# Patient Record
Sex: Female | Born: 1985 | Race: White | Hispanic: No | Marital: Married | Smoking: Former smoker
Health system: Southern US, Community
[De-identification: ages and names within clinical notes are randomized; demographics above are authoritative.]

## PROBLEM LIST (undated history)

## (undated) DIAGNOSIS — G43909 Migraine, unspecified, not intractable, without status migrainosus: Secondary | ICD-10-CM

## (undated) DIAGNOSIS — D219 Benign neoplasm of connective and other soft tissue, unspecified: Secondary | ICD-10-CM

## (undated) HISTORY — PX: OTHER SURGICAL HISTORY: SHX169

## (undated) HISTORY — PX: EYE SURGERY: SHX253

---
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
## 2005-01-17 ENCOUNTER — Ambulatory Visit: Payer: Self-pay | Admitting: Internal Medicine

## 2005-08-04 ENCOUNTER — Other Ambulatory Visit: Admission: RE | Admit: 2005-08-04 | Discharge: 2005-08-04 | Payer: Self-pay | Admitting: Gynecology

## 2016-06-05 DIAGNOSIS — R51 Headache: Secondary | ICD-10-CM | POA: Diagnosis not present

## 2016-06-06 ENCOUNTER — Emergency Department (HOSPITAL_COMMUNITY)
Admission: EM | Admit: 2016-06-06 | Discharge: 2016-06-06 | Disposition: A | Discharge status: Home / Self Care | Payer: BLUE CROSS/BLUE SHIELD | Attending: Emergency Medicine | Admitting: Emergency Medicine

## 2016-06-06 ENCOUNTER — Emergency Department (HOSPITAL_COMMUNITY): Payer: BLUE CROSS/BLUE SHIELD

## 2016-06-06 ENCOUNTER — Encounter (HOSPITAL_COMMUNITY): Payer: Self-pay | Admitting: Emergency Medicine

## 2016-06-06 DIAGNOSIS — R51 Headache: Secondary | ICD-10-CM | POA: Diagnosis not present

## 2016-06-06 DIAGNOSIS — R519 Headache, unspecified: Secondary | ICD-10-CM

## 2016-06-06 HISTORY — DX: Migraine, unspecified, not intractable, without status migrainosus: G43.909

## 2016-06-06 LAB — CBC
HCT: 38.6 % (ref 36.0–46.0)
Hemoglobin: 12.9 g/dL (ref 12.0–15.0)
MCH: 30 pg (ref 26.0–34.0)
MCHC: 33.4 g/dL (ref 30.0–36.0)
MCV: 89.8 fL (ref 78.0–100.0)
Platelets: 231 10*3/uL (ref 150–400)
RBC: 4.3 MIL/uL (ref 3.87–5.11)
RDW: 13.1 % (ref 11.5–15.5)
WBC: 5.9 10*3/uL (ref 4.0–10.5)

## 2016-06-06 LAB — BASIC METABOLIC PANEL
ANION GAP: 5 (ref 5–15)
BUN: 13 mg/dL (ref 6–20)
CALCIUM: 9.2 mg/dL (ref 8.9–10.3)
CO2: 26 mmol/L (ref 22–32)
Chloride: 109 mmol/L (ref 101–111)
Creatinine, Ser: 0.55 mg/dL (ref 0.44–1.00)
Glucose, Bld: 104 mg/dL — ABNORMAL HIGH (ref 65–99)
Potassium: 3.6 mmol/L (ref 3.5–5.1)
SODIUM: 140 mmol/L (ref 135–145)

## 2016-06-06 LAB — I-STAT BETA HCG BLOOD, ED (MC, WL, AP ONLY): I-stat hCG, quantitative: <5 m[IU]/mL (ref <5)

## 2016-06-06 MED ORDER — KETOROLAC TROMETHAMINE 30 MG/ML IJ SOLN
30.0000 mg | Freq: Once | INTRAMUSCULAR | Status: AC
  Administered 2016-06-06: 30 mg via INTRAVENOUS
  Filled 2016-06-06: qty 1

## 2016-06-06 MED ORDER — DIPHENHYDRAMINE HCL 50 MG/ML IJ SOLN
12.5000 mg | Freq: Once | INTRAMUSCULAR | Status: AC
  Administered 2016-06-06: 12.5 mg via INTRAVENOUS
  Filled 2016-06-06: qty 1

## 2016-06-06 MED ORDER — METOCLOPRAMIDE HCL 5 MG/ML IJ SOLN
10.0000 mg | Freq: Once | INTRAMUSCULAR | Status: AC
  Administered 2016-06-06: 10 mg via INTRAVENOUS
  Filled 2016-06-06: qty 2

## 2016-06-06 MED ORDER — SODIUM CHLORIDE 0.9 % IV BOLUS (SEPSIS)
500.0000 mL | Freq: Once | INTRAVENOUS | Status: AC
  Administered 2016-06-06: 500 mL via INTRAVENOUS

## 2016-06-06 NOTE — ED Notes (Addendum)
Pt is c/o of recurrent migraines, pt states that she has had a HA 1 x every 3 weeks for the past 3 weeks. Pt states that vshe took Excedrin for pain however provided no relief. Pt states that she has an appt on Monday for the recurring HA.  Pt states that her HA are accompanied by nausea.

## 2016-06-06 NOTE — ED Provider Notes (Signed)
[facility] DEPT Provider Note   CSN: [id number] Arrival date & time: 06/05/16  2338  By signing my name below, I, Marcello Moores, attest that this documentation has been prepared under the direction and in the presence of [employer], PA-C. Electronically Signed: Marcello Moores, ED Scribe. 06/06/16. 1:52 AM.   History   Chief Complaint Chief Complaint  Patient presents with  . Migraine   The history is provided by the patient and medical records. No language interpreter was used.   HPI Comments: Kayla Hayden is a 31 y.o. female, with a PMHx of self diagnosed migraines, presents to the Emergency Department complaining of constant frontal headache which began yesterday morning around 8AM. She localizes her pain to her right eye and down the right-side of her nose. Pt notes that she has a h/o headaches which have been intermittently present over the past three years. She notes that her most current bout of this began three weeks ago, and returned this morning and has been present and worsening throughout the day. She has associated nausea, light-headedness, photophobia, and mild phonophobia. She reports symptoms are always the same.  No thunderclap or sudden onset headache.  She has an appointment with a new PCP on next Monday to discuss referral to a Neurologist. She has never been formally dx'd w/ migraines. She has not self-medicated with OTC medications today for her headache. Pt denies vomiting, visual disturbance, syncope, weakness, or any other associated symptoms.   Past Medical History:  Diagnosis Date  . Migraine    There are no active problems to display for this patient.  Past Surgical History:  Procedure Laterality Date  . extraction of wisdom teeth     OB History    No data available     Home Medications    Prior to Admission medications   Not on File   Family History Family History  Problem Relation Age of Onset  . Cancer Father    Social  History Social History  Substance Use Topics  . Smoking status: Never Smoker  . Smokeless tobacco: Never Used  . Alcohol use No   Allergies   Patient has no known allergies.  Review of Systems Review of Systems  Eyes: Positive for photophobia. Negative for visual disturbance.  Gastrointestinal: Positive for nausea. Negative for vomiting.  Neurological: Positive for light-headedness and headaches. Negative for syncope and weakness.  All other systems reviewed and are negative.  Physical Exam Updated Vital Signs BP (!) 133/91 (BP Location: Right Arm)   Pulse 76   Temp 98.1 F (36.7 C) (Oral)   Resp 14   Ht 5\' 5"  (1.651 m)   Wt 115 lb (52.2 kg)   LMP 05/22/2016 (Exact Date)   SpO2 100%   BMI 19.14 kg/m   Physical Exam  Constitutional: She is oriented to person, place, and time. She appears well-developed and well-nourished. No distress.  HENT:  Head: Normocephalic and atraumatic.  Mouth/Throat: Oropharynx is clear and moist.  Eyes: Conjunctivae and EOM are normal. Pupils are equal, round, and reactive to light. No scleral icterus.  No horizontal, vertical or rotational nystagmus  Neck: Normal range of motion. Neck supple.  Full active and passive ROM without pain No midline or paraspinal tenderness No nuchal rigidity or meningeal signs  Cardiovascular: Normal rate, regular rhythm and intact distal pulses.   Pulmonary/Chest: Effort normal and breath sounds normal. No respiratory distress. She has no wheezes. She has no rales.  Abdominal: Soft. Bowel sounds are normal. There  is no tenderness. There is no rebound and no guarding.  Musculoskeletal: Normal range of motion.  Lymphadenopathy:    She has no cervical adenopathy.  Neurological: She is alert and oriented to person, place, and time. No cranial nerve deficit. She exhibits normal muscle tone. Coordination normal.  Mental Status:  Alert, oriented, thought content appropriate. Speech fluent without evidence of aphasia.  Able to follow 2 step commands without difficulty.  Cranial Nerves:  II:  Peripheral visual fields grossly normal, pupils equal, round, reactive to light III,IV, VI: ptosis not present, extra-ocular motions intact bilaterally  V,VII: smile symmetric, facial light touch sensation equal VIII: hearing grossly normal bilaterally  IX,X: midline uvula rise  XI: bilateral shoulder shrug equal and strong XII: midline tongue extension  Motor:  5/5 in upper and lower extremities bilaterally including strong and equal grip strength and dorsiflexion/plantar flexion Sensory: Pinprick and light touch normal in all extremities.  Cerebellar: normal finger-to-nose with bilateral upper extremities Gait: normal gait and balance CV: distal pulses palpable throughout   Skin: Skin is warm and dry. No rash noted. She is not diaphoretic.  Psychiatric: She has a normal mood and affect. Her behavior is normal. Judgment and thought content normal.  Nursing note and vitals reviewed.  ED Treatments / Results  DIAGNOSTIC STUDIES: Oxygen Saturation is 100% on RA, normal by my interpretation.   COORDINATION OF CARE: 1:42 AM-Discussed next steps with pt. Pt verbalized understanding and is agreeable with the plan.   Labs (all labs ordered are listed, but only abnormal results are displayed) Labs Reviewed  BASIC METABOLIC PANEL - Abnormal; Notable for the following:       Result Value   Glucose, Bld 104 (*)    All other components within normal limits  CBC  I-STAT BETA HCG BLOOD, ED ([facility], [facility], [facility] ONLY)   Radiology Ct Head Wo Contrast  Result Date: 06/06/2016 CLINICAL DATA:  Frontal and right retro-orbital headache for 2 days. Nausea and photophobia. EXAM: CT HEAD WITHOUT CONTRAST TECHNIQUE: Contiguous axial images were obtained from the base of the skull through the vertex without intravenous contrast. COMPARISON:  None. FINDINGS: Brain: There is no intracranial hemorrhage, mass or evidence of acute infarction.  There is no extra-axial fluid collection. Gray matter and white matter appear normal. Cerebral volume is normal for age. Brainstem and posterior fossa are unremarkable. The CSF spaces appear normal. Vascular: No hyperdense vessel or unexpected calcification. Skull: Normal. Negative for fracture or focal lesion. Sinuses/Orbits: No acute finding. Other: None. IMPRESSION: Normal brain Electronically Signed   By: Andreas Newport M.D.   On: 06/06/2016 04:05    Procedures Procedures   Medications Ordered in ED Medications  sodium chloride 0.9 % bolus 500 mL (500 mLs Intravenous New Bag/Given 06/06/16 0230)  metoCLOPramide (REGLAN) injection 10 mg (10 mg Intravenous Given 06/06/16 0231)  diphenhydrAMINE (BENADRYL) injection 12.5 mg (12.5 mg Intravenous Given 06/06/16 0231)  ketorolac (TORADOL) 30 MG/ML injection 30 mg (30 mg Intravenous Given 06/06/16 0456)    Initial Impression / Assessment and Plan / ED Course  I have reviewed the triage vital signs and the nursing notes.  Pertinent labs & imaging results that were available during my care of the patient were reviewed by me and considered in my medical decision making (see chart for details).     Pt HA treated and improved while in ED.  Presentation is like pts typical HA and non concerning for [facility] - Kmi, ICH, Meningitis, or temporal arteritis. Pt is afebrile with no  focal neuro deficits, nuchal rigidity, or change in vision. CT scan is without acute abnormality.  Pt is to follow up with PCP and neurology to discuss prophylactic medication. Pt verbalizes understanding and is agreeable with plan to dc.    Final Clinical Impressions(s) / ED Diagnoses   Final diagnoses:  Bad headache   New Prescriptions New Prescriptions   No medications on file   I personally performed the services described in this documentation, which was scribed in my presence. The recorded information has been reviewed and is accurate.     Jarrett Soho Ofilia Rayon, PA-C 06/06/16  [place], MD 06/06/16 [phone]

## 2016-06-06 NOTE — ED Triage Notes (Signed)
Pt states she woke up with a headache this morning around 8am and it has progressively gotten worse throughout the day  Pt states it got real bad around 3pm  Pt states the pain is around the right eye and down her nose  Pt states her body feels tingly all over  Pt says she is nauseated but has not vomited yet  Pt states this is the 4th migraine she has had since the middle of Feb

## 2016-06-06 NOTE — Discharge Instructions (Signed)
1. Medications: usual home medications 2. Treatment: rest, drink plenty of fluids,  3. Follow Up: Please followup with your primary doctor in 7 days and [facility] at your earliest convenience for discussion of your diagnoses and further evaluation after today's visit; if you do not have a primary care doctor use the resource guide provided to find one; Please return to the ER for worsening symptoms, development of neurologic symptoms or other concerns

## 2016-06-07 ENCOUNTER — Encounter: Payer: Self-pay | Admitting: Diagnostic Neuroimaging

## 2016-06-07 ENCOUNTER — Ambulatory Visit (INDEPENDENT_AMBULATORY_CARE_PROVIDER_SITE_OTHER): Payer: BLUE CROSS/BLUE SHIELD | Admitting: Diagnostic Neuroimaging

## 2016-06-07 VITALS — BP 109/74 | HR 89

## 2016-06-07 DIAGNOSIS — G43009 Migraine without aura, not intractable, without status migrainosus: Secondary | ICD-10-CM

## 2016-06-07 MED ORDER — PROMETHAZINE HCL 12.5 MG PO TABS: 12.5000 mg | ORAL_TABLET | Freq: Four times a day (QID) | ORAL | 0 refills | Status: DC | PRN

## 2016-06-07 MED ORDER — RIZATRIPTAN BENZOATE 10 MG PO TBDP: 10.0000 mg | ORAL_TABLET | ORAL | 6 refills | Status: DC | PRN

## 2016-06-07 NOTE — Patient Instructions (Signed)
Thank you for coming to see [place] at [facility]. I hope we have been able to provide you high quality care today.  You may receive a patient satisfaction survey over the next few weeks. We would appreciate your feedback and comments so that we may continue to improve ourselves and the health of our patients.  - rizatriptan 33m as needed for breakthrough headache; may repeat x 1 after 2 hours; max 2 tabs per day or 8 per month  - try the migraine buddy app  - To prevent or relieve headaches, try the following:   Cool Compress. Lie down and place a cool compress on your head.   Avoid headache triggers. If certain foods or odors seem to have triggered your migraines in the past, avoid them. A headache diary might help you identify triggers.   Include physical activity in your daily routine.   Manage stress. Find healthy ways to cope with the stressors, such as delegating tasks on your to-do list.   Practice relaxation techniques. Try deep breathing, yoga, massage and visualization.   Eat regularly. Eating regularly scheduled meals and maintaining a healthy diet might help prevent headaches. Also, drink plenty of fluids.   Follow a regular sleep schedule. Sleep deprivation might contribute to headaches  Consider biofeedback. With this mind-body technique, you learn to control certain bodily functions - such as muscle tension, heart rate and blood pressure - to prevent headaches or reduce headache pain.    ~~~~~~~~~~~~~~~~~~~~~~~~~~~~~~~~~~~~~~~~~~~~~~~~~~~~~~~~~~~~~~~~~  DR. PENUMALLI'S GUIDE TO HAPPY AND HEALTHY LIVING These are some of my general health and wellness recommendations. Some of them may apply to you better than others. Please use common sense as you try these suggestions and feel free to ask me any questions.   ACTIVITY/FITNESS Mental, social, emotional and physical stimulation are very important for brain and body health. Try learning a new  activity (arts, music, language, sports, games).  Keep moving your body to the best of your abilities. You can do this at home, inside or outside, the park, community center, gym or anywhere you like. Consider a physical therapist or personal trainer to get started. Consider the app Sworkit. Fitness trackers such as smart-watches, smart-phones or Fitbits can help as well.   NUTRITION Eat more plants: colorful vegetables, nuts, seeds and berries.  Eat less sugar, salt, preservatives and processed foods.  Avoid toxins such as cigarettes and alcohol.  Drink water when you are thirsty. Warm water with a slice of lemon is an excellent morning drink to start the day.  Consider these websites for more information The Nutrition Source (hhttps://www.henry-hernandez.biz/ [employer] Nutrition (wWindowBlog.ch   RELAXATION Consider practicing mindfulness meditation or other relaxation techniques such as deep breathing, prayer, yoga, tai chi, massage. See website mindful.org or the apps Headspace or Calm to help get started.   SLEEP Try to get at least 7-8+ hours sleep per day. Regular exercise and reduced caffeine will help you sleep better. Practice good sleep hygeine techniques. See website sleep.org for more information.   PLANNING Prepare estate planning, living will, healthcare POA documents. Sometimes this is best planned with the help of an attorney. Theconversationproject.org and agingwithdignity.org are excellent resources.
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
## 2016-06-12 DIAGNOSIS — G43909 Migraine, unspecified, not intractable, without status migrainosus: Secondary | ICD-10-CM | POA: Diagnosis not present

## 2016-06-12 DIAGNOSIS — F419 Anxiety disorder, unspecified: Secondary | ICD-10-CM | POA: Diagnosis not present

## 2016-06-12 DIAGNOSIS — F410 Panic disorder [episodic paroxysmal anxiety] without agoraphobia: Secondary | ICD-10-CM | POA: Diagnosis not present

## 2016-08-01 ENCOUNTER — Telehealth: Payer: Self-pay | Admitting: *Deleted

## 2016-08-01 NOTE — Telephone Encounter (Signed)
Spoke with patient to reschedule her July 6th follow up due to dr being out of office. She accepted appt tomorrow afternoon. She verbalized understanding to arrive 30 min early for check in.

## 2016-08-02 ENCOUNTER — Ambulatory Visit (INDEPENDENT_AMBULATORY_CARE_PROVIDER_SITE_OTHER): Payer: BLUE CROSS/BLUE SHIELD | Admitting: Diagnostic Neuroimaging

## 2016-08-02 ENCOUNTER — Encounter (INDEPENDENT_AMBULATORY_CARE_PROVIDER_SITE_OTHER): Payer: Self-pay

## 2016-08-02 ENCOUNTER — Encounter: Payer: Self-pay | Admitting: Diagnostic Neuroimaging

## 2016-08-02 VITALS — BP 118/75 | HR 74 | Wt 114.6 lb

## 2016-08-02 DIAGNOSIS — G43009 Migraine without aura, not intractable, without status migrainosus: Secondary | ICD-10-CM | POA: Diagnosis not present

## 2016-08-02 MED ORDER — AMITRIPTYLINE HCL 25 MG PO TABS: 25.0000 mg | ORAL_TABLET | Freq: Every day | ORAL | 6 refills | Status: DC

## 2016-08-02 MED ORDER — DICLOFENAC POTASSIUM(MIGRAINE) 50 MG PO PACK: 50.0000 mg | PACK | ORAL | 3 refills | Status: DC | PRN

## 2016-08-02 MED ORDER — RIZATRIPTAN BENZOATE 10 MG PO TBDP: 10.0000 mg | ORAL_TABLET | ORAL | 6 refills | Status: DC | PRN
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
## 2016-08-02 NOTE — Patient Instructions (Signed)
-   start amitriptyline 25mg  at bedtime; stop zoloft  - trial of cambia as needed for migraine rescue; 1 packet per day maximum  - continue rizatriptan 10mg  as needed for breakthrough headache; may repeat x 1 after 2 hours; max 2 tabs per day or 8 per month  - may consider botox for chronic migraine if headache frequency increases

## 2016-08-03 ENCOUNTER — Telehealth: Payer: Self-pay | Admitting: *Deleted

## 2016-08-03 NOTE — Telephone Encounter (Signed)
Received fax from [facility] re: insurance denial of coverage for Cambia powder. New Rx request successfully  faxed to [facility].

## 2016-08-07 ENCOUNTER — Telehealth: Payer: Self-pay | Admitting: Diagnostic Neuroimaging

## 2016-08-07 NOTE — Telephone Encounter (Signed)
Kayla Hayden from [facility] [place] called to inform of denial request for medication:Diclofenac Potassium (CAMBIA) 50 MG PACK  need to try Diclofenac 1st.

## 2016-08-07 NOTE — Telephone Encounter (Signed)
Noted.  Prescription sent to [facility]. (see previous note).

## 2016-08-11 ENCOUNTER — Ambulatory Visit: Payer: BLUE CROSS/BLUE SHIELD | Admitting: Diagnostic Neuroimaging

## 2016-08-14 NOTE — Telephone Encounter (Signed)
Called [facility] re: status of Cambia prescription. Spoke [facility] who stated a PA was submitted on 08/07/16. He checked PA dept and stated that it was denied. He stated with a pharmacy discount card she can get medication with a  copay of $20. He stated [facility] will call patient and inform her.

## 2016-08-30 NOTE — Telephone Encounter (Signed)
Called to check on status of cambia from [facility].  [phone], fax [phone].    PA was denied, using copay card good for 6 months.  (from 08-16-16), after this copay will go to $50.00.  Pt has appt in 10/2016 with MM/NP. Can discuss when in for appt to see if working and appeal.

## 2016-11-01 ENCOUNTER — Ambulatory Visit (INDEPENDENT_AMBULATORY_CARE_PROVIDER_SITE_OTHER): Payer: BLUE CROSS/BLUE SHIELD | Admitting: Adult Health

## 2016-11-01 ENCOUNTER — Encounter (INDEPENDENT_AMBULATORY_CARE_PROVIDER_SITE_OTHER): Payer: Self-pay

## 2016-11-01 ENCOUNTER — Encounter: Payer: Self-pay | Admitting: Adult Health

## 2016-11-01 VITALS — BP 109/75 | HR 84 | Ht 64.0 in | Wt 120.0 lb

## 2016-11-01 DIAGNOSIS — G43009 Migraine without aura, not intractable, without status migrainosus: Secondary | ICD-10-CM

## 2016-11-01 MED ORDER — AMITRIPTYLINE HCL 10 MG PO TABS: 30.0000 mg | ORAL_TABLET | Freq: Every day | ORAL | 5 refills | Status: DC
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
## 2016-11-01 NOTE — Patient Instructions (Addendum)
Your Plan:  Increase Amtriptyline 30 mg at bedtime Continue maxalt for headache To prevent or relieve headaches, try the following: Cool Compress. Lie down and place a cool compress on your head.  Avoid headache triggers. If certain foods or odors seem to have triggered your migraines in the past, avoid them. A headache diary might help you identify triggers.  Include physical activity in your daily routine. Try a daily walk or other moderate aerobic exercise.  Manage stress. Find healthy ways to cope with the stressors, such as delegating tasks on your to-do list.  Practice relaxation techniques. Try deep breathing, yoga, massage and visualization.  Eat regularly. Eating regularly scheduled meals and maintaining a healthy diet might help prevent headaches. Also, drink plenty of fluids.  Follow a regular sleep schedule. Sleep deprivation might contribute to headaches Consider biofeedback. With this mind-body technique, you learn to control certain bodily functions - such as muscle tension, heart rate and blood pressure - to prevent headaches or reduce headache pain.    Proceed to emergency room if you experience new or worsening symptoms or symptoms do not resolve, if you have new neurologic symptoms or if headache is severe, or for any concerning symptom.     Thank you for coming to see [place] at [facility]. I hope we have been able to provide you high quality care today.  You may receive a patient satisfaction survey over the next few weeks. We would appreciate your feedback and comments so that we may continue to improve ourselves and the health of our patients.

## 2016-11-21 NOTE — Progress Notes (Signed)
I reviewed note and agree with plan.   Tomica Arseneault R. Adalin Vanderploeg, MD  Certified in Neurology, Neurophysiology and Neuroimaging  [facility] [address], [address] [place], [place] [address] [phone]   

## 2016-11-29 ENCOUNTER — Other Ambulatory Visit: Payer: Self-pay | Admitting: Adult Health

## 2016-12-13 ENCOUNTER — Telehealth: Payer: Self-pay | Admitting: Adult Health

## 2016-12-13 NOTE — Telephone Encounter (Signed)
Patient is having bloody stools taking amitriptyline (ELAVIL) 10 MG tablet. Is there another medication she can take? [facility] [address].

## 2016-12-14 MED ORDER — TOPIRAMATE 25 MG PO TABS: ORAL_TABLET | ORAL | 5 refills | Status: DC

## 2016-12-14 NOTE — Telephone Encounter (Addendum)
I called the patient.  She states that amitriptyline has been beneficial for her headaches however she cannot tolerate side effect of constipation.  She states that she is having blood in the stool that she feels may be due to constipation.  She does plan to follow-up with her PCP regarding this.  We will wean the patient off of amitriptyline.  She will decrease to 20 mg for 1 week and then 10 mg for 1 week and then stop the medication.  She will be started on Topamax 25 mg at bedtime for 1 week then increasing to 50 mg thereafter.  I have reviewed potential side effects with the patient.  I have advised the patient that Topamax is not compatible with pregnancy therefore she should use two birth control methods in order to ensure that she does not get pregnant on this medication.  I advised the patient that Topamax is a pregnancy category D.  It has been found that Topamax can cause fetal harm if administered to a pregnant woman-an  increased risks of oral cleft and for being small for gestational age has been observed following in utero exposure ( Data from [place]). She voiced understanding.  We also discussed potentially trying Inderal or gabapentin versus Topamax.  She states that she would rather try Topamax.  she is advised that if her symptoms do not improve she should let us know.  She voiced understanding.

## 2016-12-14 NOTE — Addendum Note (Signed)
Addended by: Trudie Buckler on: 12/14/2016 10:41 AM   Modules accepted: Orders

## 2016-12-14 NOTE — Telephone Encounter (Addendum)
Spoke with patient and advised her that Amitriptyline typically doesn't cause bloody stools but can cause constipation or diarrhea. Inquired if she has experienced either of these. She stated she has been very constipated, straining with every BM. She takes fiber and probiotic daily. She also has history of hemorrhoids. This RN advised she may have bleeding from straining plus hemorrhoids. Patient stated the blood in the toilet is as if she is having her menstrual cycle. This RN advised she needs to call her PCP to discuss, but also will send note to NP. Patient then stated she is not happy with amitriptyline as headache preventative anyway. Would like to switch to another medication that is compatible with Zoloft so she can restart taking it.  This RN stated will route her request to NP. Patient verbalized understanding, appreciation.

## 2017-01-17 ENCOUNTER — Other Ambulatory Visit: Payer: Self-pay | Admitting: Neurology

## 2017-01-17 MED ORDER — AMITRIPTYLINE HCL 25 MG PO TABS: 25.0000 mg | ORAL_TABLET | Freq: Every day | ORAL | 5 refills | Status: DC

## 2017-01-17 NOTE — Telephone Encounter (Signed)
Pt called she experienced dizziness and felt "out of it", hands and feet numb and tingling while taking topamax. 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 She is wanting to know if she could go back on amitriptyline. Please call to advise at [phone]

## 2017-01-17 NOTE — Telephone Encounter (Signed)
Called the patient and she states that if she remembers correctly she didn't have as much constipation issues with it at the strength of 25 mg and she would like to try that dosage first. The patient states that with taking the Topamax she may not have given it the amount of time to work and be beneficial. I informed the pt that after a while of using it those side effects can lighten up. The patient states that she doesn't want to try that again. She would rather try and start with the amitriptyline. I confirmed her pharmacy and explained that we would send those orders for her today. Pt verbalized understanding.

## 2017-01-17 NOTE — Telephone Encounter (Signed)
Ok to go back to amitriptyline 30 mg at bedtime. However she stopped it d/t to constipation? Let her know that the side effects of Topamax can sometimes get better overtime if she has found it beneficial for her headaches.

## 2017-03-14 ENCOUNTER — Ambulatory Visit: Payer: BLUE CROSS/BLUE SHIELD | Admitting: Adult Health

## 2017-06-06 ENCOUNTER — Telehealth: Payer: Self-pay | Admitting: Adult Health

## 2017-06-06 NOTE — Telephone Encounter (Signed)
Spoke to Kayla Hayden and she is 2.[redacted] wks pregnant.  She has not seen ob-gyn yet. (will be filling out p/w and making appt with them soon).  She is not taking amitriptyline as not working, her pcp change to inderal and she stopped this 2 wks ago. She is asking about taking maxalt, phenergan and cambia while pregnant, and if not are there other options.  She is not taking anything right now.  Her pcp is Stage manager PA with [employer] at [facility].  She is ok to wait until Dr. Leta Baptist back tomorrow to address.  Has appt with MM/NP 07/05/2017 for RV.

## 2017-06-06 NOTE — Telephone Encounter (Signed)
Pt called stating that earlier last week she found out she is pregnant. Stating its very early on and is wanting to make sure all rescue medications are safe for her to take. Please call to advise

## 2017-06-07 NOTE — Telephone Encounter (Signed)
Pt picked up then hung up.  I called back and LMVM for her per Dr. Leta Baptist recommendation relating to taking migraine medications while pregnant. I relayed per Dr. Leta Baptist that for maximum safety, he did not recommend taking any of the meds (cambia, phenergan, maxalt, elavil) during pregnancy except tylenol as needed.  I f headaches are very severe, then can discuss risk, vs benefit analysis of maxalt.  She has a ppt 5/30 with MM/NP.  She is to call back as needed.

## 2017-06-07 NOTE — Telephone Encounter (Signed)
In general for maximum safety, I do not recommend any of these meds during pregnancy except tylenol as needed.   If headaches are very severe, then we can discuss risk / benefit analysis of maxalt (triptan).   -VRP

## 2017-06-21 ENCOUNTER — Other Ambulatory Visit: Payer: Self-pay | Admitting: Diagnostic Neuroimaging

## 2017-07-05 ENCOUNTER — Ambulatory Visit: Payer: Self-pay | Admitting: Adult Health

## 2017-07-11 LAB — OB RESULTS CONSOLE HEPATITIS B SURFACE ANTIGEN: Hepatitis B Surface Ag: NEGATIVE

## 2017-07-11 LAB — OB RESULTS CONSOLE RUBELLA ANTIBODY, IGM: Rubella: IMMUNE

## 2017-07-11 LAB — OB RESULTS CONSOLE GC/CHLAMYDIA
Chlamydia: NEGATIVE
Gonorrhea: NEGATIVE

## 2017-07-11 LAB — OB RESULTS CONSOLE ABO/RH: RH Type: POSITIVE

## 2017-07-11 LAB — OB RESULTS CONSOLE RPR: RPR: NONREACTIVE

## 2017-07-11 LAB — OB RESULTS CONSOLE HIV ANTIBODY (ROUTINE TESTING): HIV: NONREACTIVE

## 2017-07-11 LAB — OB RESULTS CONSOLE GBS: STREP GROUP B AG: NEGATIVE

## 2017-07-11 LAB — OB RESULTS CONSOLE ANTIBODY SCREEN: Antibody Screen: NEGATIVE

## 2017-08-24 ENCOUNTER — Telehealth: Payer: Self-pay | Admitting: Diagnostic Neuroimaging

## 2017-08-24 NOTE — Telephone Encounter (Addendum)
Pt's headaches have gotten worse since pregnancy. She is getting them 1 x every 2 weeks lasting about 2 days. She has been taking sumatriptan when the HA's are bad, said she has no other options. She said her OBGYN did not want her taking triptans. Pt also said she is missing work due to the [employer] and is being given a hard time about her absences, she is wanting to know if she can be seen sooner. She is aware the clinic closes at noon today. Please call to advise

## 2017-08-27 NOTE — Telephone Encounter (Signed)
LVM apologizing for not getting back in touch with her on Friday before the office closed. Requested she call back to discuss in more detail. Left office number.

## 2017-08-27 NOTE — Telephone Encounter (Signed)
Spoke with patient and read her the NP's note. She stated that she stopped taking Amitriptyline when she found out she was pregnant around March. She also stopped taking Cambia. She stated that she would like a copy of Megan's note to take to her work place. She stated they are giving her a hard time about missing work.  This RN advised her she has to sign a release to get a copy. Also advised her to speak with her HR or supervisor as many employers have forms that the physician must complete regarding why the person is missing work. She verbalized understanding of call, appreciation.

## 2017-08-27 NOTE — Telephone Encounter (Signed)
I do not recommend sumatriptan in pregnancy. I typically recommend APAP as its's considered safe. No other options are considered completely safe in pregnancy. Also Amitriptyline (which she is on) is a category C in pregnancy- in some case reports CNS effects, limb deformities and developmental delays were noted. I would recommend that she consider weaning off this medication if possible to decrease potential birth defects. Of course she can discuss with OB and get there recommendations as well. Migraines are hard to treat in pregnancy due to limited options of safe medications.

## 2017-08-27 NOTE — Telephone Encounter (Addendum)
Patient called back, she is [redacted] weeks pregnant. Before getting pregnant, her PCP prescribed sumatriptan as needed to get relief. She stated it is somewhat helpful, but  makes her loopy. She had to take two for relief, but that knocks her out, and she is unable to work.  She stated rizatriptan helped best, but her PCP cancelled it when sumatriptan was prescribed.  She now is having headaches every other week, lasting a few days. Her OB prescribed apap with caffeine which helped a little. She now has a  new OB who told her it is okay to take sumatriptan, however patient was advised to consult or see her neurologist regarding other headache options besides triptans. This RN stated will discuss with NP and call her back. She verbalized understanding, appreciation.

## 2017-10-25 ENCOUNTER — Ambulatory Visit: Payer: Self-pay | Admitting: Adult Health

## 2017-10-25 ENCOUNTER — Encounter

## 2017-10-25 ENCOUNTER — Encounter: Payer: Self-pay | Admitting: Adult Health

## 2017-10-25 VITALS — BP 118/74 | HR 100 | Ht 64.0 in | Wt 152.8 lb

## 2017-10-25 DIAGNOSIS — G43009 Migraine without aura, not intractable, without status migrainosus: Secondary | ICD-10-CM

## 2017-10-25 NOTE — Patient Instructions (Signed)
Your Plan:  Continue To monitor symptoms If headache frequency increases let us know If your symptoms worsen or you develop new symptoms please let us know.   Thank you for coming to see [place] at [facility]. I hope we have been able to provide you high quality care today.  You may receive a patient satisfaction survey over the next few weeks. We would appreciate your feedback and comments so that we may continue to improve ourselves and the health of our patients.
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
## 2017-10-25 NOTE — Progress Notes (Signed)
I reviewed note and agree with plan.   Penni Bombard, MD 03/30/4112, 64:31 AM Certified in Neurology, Neurophysiology and Neuroimaging  [facility] [address], [place], [place] [address] [phone]

## 2018-02-06 NOTE — L&D Delivery Note (Signed)
Delivery Note Pt pushed for approximately 10 minutes and  at 3:09 AM a viable female was delivered via Vaginal, Spontaneous (Presentation: OA;  ).  APGAR: 8,8, ; weight pending .   Placenta status:delivered intact, schultz , .  Cord: 3vc with the following complications:none .  Cord pH: n/a  Anesthesia:Epidural and local 1% lidocaine   Episiotomy: None Lacerations: 1st degree;Vaginal Suture Repair: 2.0 vicryl Est. Blood Loss (mL):    Mom to postpartum.  Baby to Couplet care / Skin to Skin  Pt would like a circumcision while in the hospital.   [place] 02/09/2018, 3:38 AM

## 2018-02-08 ENCOUNTER — Inpatient Hospital Stay (HOSPITAL_COMMUNITY)
Admission: AD | Admit: 2018-02-08 | Discharge: 2018-02-11 | DRG: 807 | Disposition: A | Discharge status: Home / Self Care | Payer: BLUE CROSS/BLUE SHIELD | Attending: Obstetrics and Gynecology | Admitting: Obstetrics and Gynecology

## 2018-02-08 ENCOUNTER — Other Ambulatory Visit: Payer: Self-pay

## 2018-02-08 ENCOUNTER — Encounter (HOSPITAL_COMMUNITY): Payer: Self-pay | Admitting: *Deleted

## 2018-02-08 DIAGNOSIS — D259 Leiomyoma of uterus, unspecified: Secondary | ICD-10-CM | POA: Diagnosis not present

## 2018-02-08 DIAGNOSIS — O163 Unspecified maternal hypertension, third trimester: Secondary | ICD-10-CM

## 2018-02-08 DIAGNOSIS — O134 Gestational [pregnancy-induced] hypertension without significant proteinuria, complicating childbirth: Principal | ICD-10-CM | POA: Diagnosis present

## 2018-02-08 DIAGNOSIS — Z87891 Personal history of nicotine dependence: Secondary | ICD-10-CM

## 2018-02-08 DIAGNOSIS — Z3A40 40 weeks gestation of pregnancy: Secondary | ICD-10-CM

## 2018-02-08 DIAGNOSIS — O3413 Maternal care for benign tumor of corpus uteri, third trimester: Secondary | ICD-10-CM | POA: Diagnosis present

## 2018-02-08 DIAGNOSIS — O169 Unspecified maternal hypertension, unspecified trimester: Secondary | ICD-10-CM | POA: Diagnosis present

## 2018-02-08 HISTORY — DX: Benign neoplasm of connective and other soft tissue, unspecified: D21.9

## 2018-02-08 LAB — CBC
HEMATOCRIT: 37.7 % (ref 36.0–46.0)
Hemoglobin: 12.6 g/dL (ref 12.0–15.0)
MCH: 31.1 pg (ref 26.0–34.0)
MCHC: 33.4 g/dL (ref 30.0–36.0)
MCV: 93.1 fL (ref 80.0–100.0)
Platelets: 181 10*3/uL (ref 150–400)
RBC: 4.05 MIL/uL (ref 3.87–5.11)
RDW: 13.9 % (ref 11.5–15.5)
WBC: 6.9 10*3/uL (ref 4.0–10.5)
nRBC: 0 % (ref 0.0–0.2)

## 2018-02-08 LAB — PROTEIN / CREATININE RATIO, URINE
CREATININE, URINE: 44 mg/dL
Protein Creatinine Ratio: 0.2 mg/mg{Cre} — ABNORMAL HIGH (ref 0.00–0.15)
TOTAL PROTEIN, URINE: 9 mg/dL

## 2018-02-08 LAB — COMPREHENSIVE METABOLIC PANEL
ALBUMIN: 3.3 g/dL — AB (ref 3.5–5.0)
ALT: 45 U/L — ABNORMAL HIGH (ref 0–44)
ANION GAP: 9 (ref 5–15)
AST: 39 U/L (ref 15–41)
Alkaline Phosphatase: 98 U/L (ref 38–126)
BILIRUBIN TOTAL: 0.5 mg/dL (ref 0.3–1.2)
BUN: 10 mg/dL (ref 6–20)
CO2: 18 mmol/L — ABNORMAL LOW (ref 22–32)
Calcium: 8.6 mg/dL — ABNORMAL LOW (ref 8.9–10.3)
Chloride: 108 mmol/L (ref 98–111)
Creatinine, Ser: 0.48 mg/dL (ref 0.44–1.00)
GFR calc non Af Amer: >60 mL/min (ref >60)
Glucose, Bld: 82 mg/dL (ref 70–99)
POTASSIUM: 3.8 mmol/L (ref 3.5–5.1)
Sodium: 135 mmol/L (ref 135–145)
TOTAL PROTEIN: 6.6 g/dL (ref 6.5–8.1)

## 2018-02-08 LAB — URINALYSIS, ROUTINE W REFLEX MICROSCOPIC
Bilirubin Urine: NEGATIVE
Glucose, UA: NEGATIVE mg/dL
HGB URINE DIPSTICK: NEGATIVE
Ketones, ur: NEGATIVE mg/dL
Nitrite: NEGATIVE
Protein, ur: NEGATIVE mg/dL
SPECIFIC GRAVITY, URINE: 1.009 (ref 1.005–1.030)
pH: 6 (ref 5.0–8.0)

## 2018-02-08 LAB — TYPE AND SCREEN
ABO/RH(D): B POS
Antibody Screen: NEGATIVE

## 2018-02-08 LAB — ABO/RH: ABO/RH(D): B POS

## 2018-02-08 MED ORDER — OXYTOCIN 40 UNITS IN LACTATED RINGERS INFUSION - SIMPLE MED
1.0000 m[IU]/min | INTRAVENOUS | Status: DC
  Filled 2018-02-08: qty 1000

## 2018-02-08 MED ORDER — BUTORPHANOL TARTRATE 1 MG/ML IJ SOLN
1.0000 mg | INTRAMUSCULAR | Status: DC | PRN
  Administered 2018-02-08: 1 mg via INTRAVENOUS
  Filled 2018-02-08: qty 1

## 2018-02-08 MED ORDER — ONDANSETRON HCL 4 MG/2ML IJ SOLN
4.0000 mg | Freq: Four times a day (QID) | INTRAMUSCULAR | Status: DC | PRN
  Administered 2018-02-08: 4 mg via INTRAVENOUS
  Filled 2018-02-08: qty 2

## 2018-02-08 MED ORDER — OXYCODONE-ACETAMINOPHEN 5-325 MG PO TABS: 1.0000 | ORAL_TABLET | ORAL | Status: DC | PRN

## 2018-02-08 MED ORDER — TERBUTALINE SULFATE 1 MG/ML IJ SOLN
0.2500 mg | Freq: Once | INTRAMUSCULAR | Status: DC | PRN
  Filled 2018-02-08: qty 1

## 2018-02-08 MED ORDER — LACTATED RINGERS IV SOLN
INTRAVENOUS | Status: DC
  Administered 2018-02-08: 16:00:00 via INTRAVENOUS

## 2018-02-08 MED ORDER — ACETAMINOPHEN 325 MG PO TABS: 650.0000 mg | ORAL_TABLET | ORAL | Status: DC | PRN

## 2018-02-08 MED ORDER — MISOPROSTOL 25 MCG QUARTER TABLET
25.0000 ug | ORAL_TABLET | ORAL | Status: DC | PRN
  Administered 2018-02-08: 25 ug via VAGINAL
  Filled 2018-02-08 (×2): qty 1

## 2018-02-08 MED ORDER — FLEET ENEMA 7-19 GM/118ML RE ENEM: 1.0000 | ENEMA | RECTAL | Status: DC | PRN

## 2018-02-08 MED ORDER — OXYCODONE-ACETAMINOPHEN 5-325 MG PO TABS: 2.0000 | ORAL_TABLET | ORAL | Status: DC | PRN

## 2018-02-08 MED ORDER — OXYTOCIN BOLUS FROM INFUSION
500.0000 mL | Freq: Once | INTRAVENOUS | Status: AC
  Administered 2018-02-09: 500 mL via INTRAVENOUS

## 2018-02-08 MED ORDER — LIDOCAINE HCL (PF) 1 % IJ SOLN
30.0000 mL | INTRAMUSCULAR | Status: DC | PRN
  Administered 2018-02-09: 30 mL via SUBCUTANEOUS
  Filled 2018-02-08: qty 30

## 2018-02-08 MED ORDER — OXYTOCIN 40 UNITS IN LACTATED RINGERS INFUSION - SIMPLE MED
2.5000 [IU]/h | INTRAVENOUS | Status: DC
  Administered 2018-02-09: 2.5 [IU]/h via INTRAVENOUS

## 2018-02-08 MED ORDER — LACTATED RINGERS IV SOLN
500.0000 mL | INTRAVENOUS | Status: DC | PRN
  Administered 2018-02-08: 500 mL via INTRAVENOUS

## 2018-02-08 MED ORDER — SOD CITRATE-CITRIC ACID 500-334 MG/5ML PO SOLN: 30.0000 mL | ORAL | Status: DC | PRN

## 2018-02-08 MED ORDER — FENTANYL CITRATE (PF) 100 MCG/2ML IJ SOLN
50.0000 ug | INTRAMUSCULAR | Status: DC | PRN
  Administered 2018-02-08 – 2018-02-09 (×2): 50 ug via INTRAVENOUS
  Filled 2018-02-08 (×2): qty 2

## 2018-02-08 NOTE — MAU Note (Signed)
Sent over from office, "blood pressure was really high."  First time elevated.  Does feel really hungry and kind of dizzy now. Denies HA, some blurring yesterday,denies epigastric pain.  Feet swollen.  (FT)
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
## 2018-02-08 NOTE — Anesthesia Pain Management Evaluation Note (Signed)
  CRNA Pain Management Visit Note  Patient: Kayla Hayden, 33 y.o., female  "Hello I am a member of the anesthesia team at [facility]. We have an anesthesia team available at all times to provide care throughout the hospital, including epidural management and anesthesia for C-section. I don't know your plan for the delivery whether it a natural birth, water birth, IV sedation, nitrous supplementation, doula or epidural, but we want to meet your pain goals."   1.Was your pain managed to your expectations on prior hospitalizations?   No prior hospitalizations  2.What is your expectation for pain management during this hospitalization?     Epidural  3.How can we help you reach that goal? epidural  Record the patient's initial score and the patient's pain goal.   Pain: 2  Pain Goal: 5 [facility] wants you to be able to say your pain was always managed very well.  Carrieanne Kleen 02/08/2018
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
## 2018-02-09 ENCOUNTER — Inpatient Hospital Stay (HOSPITAL_COMMUNITY): Payer: BLUE CROSS/BLUE SHIELD | Admitting: Anesthesiology

## 2018-02-09 ENCOUNTER — Encounter (HOSPITAL_COMMUNITY): Payer: Self-pay

## 2018-02-09 LAB — CBC
HCT: 36.7 % (ref 36.0–46.0)
HCT: 33.3 % — ABNORMAL LOW (ref 36.0–46.0)
Hemoglobin: 12.1 g/dL (ref 12.0–15.0)
Hemoglobin: 11.1 g/dL — ABNORMAL LOW (ref 12.0–15.0)
MCH: 30.8 pg (ref 26.0–34.0)
MCH: 31.2 pg (ref 26.0–34.0)
MCHC: 33 g/dL (ref 30.0–36.0)
MCHC: 33.3 g/dL (ref 30.0–36.0)
MCV: 93.4 fL (ref 80.0–100.0)
MCV: 93.5 fL (ref 80.0–100.0)
NRBC: 0 % (ref 0.0–0.2)
Platelets: 171 10*3/uL (ref 150–400)
Platelets: 163 10*3/uL (ref 150–400)
RBC: 3.93 MIL/uL (ref 3.87–5.11)
RBC: 3.56 MIL/uL — ABNORMAL LOW (ref 3.87–5.11)
RDW: 14 % (ref 11.5–15.5)
RDW: 14 % (ref 11.5–15.5)
WBC: 12.5 10*3/uL — ABNORMAL HIGH (ref 4.0–10.5)
WBC: 11.1 10*3/uL — ABNORMAL HIGH (ref 4.0–10.5)
nRBC: 0 % (ref 0.0–0.2)

## 2018-02-09 LAB — RPR: RPR Ser Ql: NONREACTIVE

## 2018-02-09 MED ORDER — EPHEDRINE 5 MG/ML INJ
10.0000 mg | INTRAVENOUS | Status: DC | PRN
  Filled 2018-02-09: qty 2

## 2018-02-09 MED ORDER — LABETALOL HCL 100 MG PO TABS
100.0000 mg | ORAL_TABLET | Freq: Once | ORAL | Status: AC
  Administered 2018-02-10: 100 mg via ORAL
  Filled 2018-02-09: qty 1

## 2018-02-09 MED ORDER — ACETAMINOPHEN 325 MG PO TABS: 650.0000 mg | ORAL_TABLET | ORAL | Status: DC | PRN

## 2018-02-09 MED ORDER — DIPHENHYDRAMINE HCL 25 MG PO CAPS: 25.0000 mg | ORAL_CAPSULE | Freq: Four times a day (QID) | ORAL | Status: DC | PRN

## 2018-02-09 MED ORDER — FENTANYL 2.5 MCG/ML BUPIVACAINE 1/10 % EPIDURAL INFUSION (WH - ANES)
14.0000 mL/h | INTRAMUSCULAR | Status: DC | PRN
  Administered 2018-02-09: 14 mL/h via EPIDURAL
  Filled 2018-02-09: qty 100

## 2018-02-09 MED ORDER — ONDANSETRON HCL 4 MG PO TABS: 4.0000 mg | ORAL_TABLET | ORAL | Status: DC | PRN

## 2018-02-09 MED ORDER — IBUPROFEN 600 MG PO TABS
600.0000 mg | ORAL_TABLET | Freq: Four times a day (QID) | ORAL | Status: DC
  Administered 2018-02-09 – 2018-02-11 (×9): 600 mg via ORAL
  Filled 2018-02-09 (×10): qty 1

## 2018-02-09 MED ORDER — WITCH HAZEL-GLYCERIN EX PADS: 1.0000 "application " | MEDICATED_PAD | CUTANEOUS | Status: DC | PRN

## 2018-02-09 MED ORDER — PRENATAL MULTIVITAMIN CH
1.0000 | ORAL_TABLET | Freq: Every day | ORAL | Status: DC
  Administered 2018-02-09 – 2018-02-10 (×2): 1 via ORAL
  Filled 2018-02-09 (×3): qty 1

## 2018-02-09 MED ORDER — SIMETHICONE 80 MG PO CHEW: 80.0000 mg | CHEWABLE_TABLET | ORAL | Status: DC | PRN

## 2018-02-09 MED ORDER — OXYCODONE HCL 5 MG PO TABS: 5.0000 mg | ORAL_TABLET | ORAL | Status: DC | PRN

## 2018-02-09 MED ORDER — DIPHENHYDRAMINE HCL 50 MG/ML IJ SOLN: 12.5000 mg | INTRAMUSCULAR | Status: DC | PRN

## 2018-02-09 MED ORDER — TETANUS-DIPHTH-ACELL PERTUSSIS 5-2.5-18.5 LF-MCG/0.5 IM SUSP: 0.5000 mL | Freq: Once | INTRAMUSCULAR | Status: DC

## 2018-02-09 MED ORDER — TERBUTALINE SULFATE 1 MG/ML IJ SOLN
INTRAMUSCULAR | Status: AC
  Filled 2018-02-09: qty 1

## 2018-02-09 MED ORDER — COCONUT OIL OIL: 1.0000 "application " | TOPICAL_OIL | Status: DC | PRN

## 2018-02-09 MED ORDER — LIDOCAINE-EPINEPHRINE (PF) 2 %-1:200000 IJ SOLN
INTRAMUSCULAR | Status: DC | PRN
  Administered 2018-02-09: 4 mL via EPIDURAL
  Administered 2018-02-09: 3 mL via EPIDURAL

## 2018-02-09 MED ORDER — ZOLPIDEM TARTRATE 5 MG PO TABS: 5.0000 mg | ORAL_TABLET | Freq: Every evening | ORAL | Status: DC | PRN

## 2018-02-09 MED ORDER — PHENYLEPHRINE 40 MCG/ML (10ML) SYRINGE FOR IV PUSH (FOR BLOOD PRESSURE SUPPORT)
80.0000 ug | PREFILLED_SYRINGE | INTRAVENOUS | Status: DC | PRN
  Filled 2018-02-09 (×2): qty 10

## 2018-02-09 MED ORDER — OXYCODONE HCL 5 MG PO TABS: 10.0000 mg | ORAL_TABLET | ORAL | Status: DC | PRN

## 2018-02-09 MED ORDER — PHENYLEPHRINE 40 MCG/ML (10ML) SYRINGE FOR IV PUSH (FOR BLOOD PRESSURE SUPPORT)
80.0000 ug | PREFILLED_SYRINGE | INTRAVENOUS | Status: DC | PRN
  Filled 2018-02-09: qty 10

## 2018-02-09 MED ORDER — BENZOCAINE-MENTHOL 20-0.5 % EX AERO: 1.0000 "application " | INHALATION_SPRAY | CUTANEOUS | Status: DC | PRN

## 2018-02-09 MED ORDER — LABETALOL HCL 100 MG PO TABS
100.0000 mg | ORAL_TABLET | Freq: Two times a day (BID) | ORAL | Status: DC
  Administered 2018-02-10 (×2): 100 mg via ORAL
  Filled 2018-02-09 (×2): qty 1

## 2018-02-09 MED ORDER — DIBUCAINE 1 % RE OINT: 1.0000 "application " | TOPICAL_OINTMENT | RECTAL | Status: DC | PRN

## 2018-02-09 MED ORDER — ONDANSETRON HCL 4 MG/2ML IJ SOLN: 4.0000 mg | INTRAMUSCULAR | Status: DC | PRN

## 2018-02-09 MED ORDER — LACTATED RINGERS IV SOLN
500.0000 mL | Freq: Once | INTRAVENOUS | Status: AC
  Administered 2018-02-09: 500 mL via INTRAVENOUS

## 2018-02-09 MED ORDER — SENNOSIDES-DOCUSATE SODIUM 8.6-50 MG PO TABS
2.0000 | ORAL_TABLET | ORAL | Status: DC
  Administered 2018-02-09 – 2018-02-10 (×2): 2 via ORAL
  Filled 2018-02-09 (×2): qty 2

## 2018-02-09 NOTE — Lactation Note (Signed)
This note was copied from a baby's chart. Lactation Consultation Note  Patient Name: Kayla Hayden PVXYI'[id number] Date: 02/09/2018 Reason for consult: Term;Primapara;1st time breastfeeding  82 is 44 hours old  LC reviewed the doc flow sheets and only 2 attempts have been written since birth.  Per mom the baby did not latch after birth or in L/D, and only attempts.  Mom unable recall times.  Per Roetta Sessions Carolan Shiver still needs to update attempts.  LC offered to see if baby would feed at consult/ mom receptive to assistance and hand expressing.  Several drops expressed/ baby sleepy, LC woke the baby up / STS/ burped/ awake enough and  LC assessed suck with gloved finger and massaged gum line and tongue with EBM. Noted high  Palate. Weak suck. Baby more awake, attempted to latch / he opened wide and LC molded the breast  Tissue against the roof of his mouth a few sucks/ no swallows/ and fell asleep.  No S/S's of low blood sugar noted. Per mom I only pushed 60 mins and her was out/ has been alittle spiity/ ( not at consult )  And he was mostly awake in my belly.  LC recommended to try hourly for 10 mins / STS / hand express/ spoon feed until she has a good feeding.  By 18 hours if still sluggish with feedings to have the [place] set up a DEBP and stimulate breast along with hand expressing.  LC explained to mom, dad and grandmother, the [facility] plan of care and the reasons for adding the pumping if needed.   LC reported the findings of the consult to the [facility] and [place].   Mother informed of post-discharge support and given phone number to the lactation department, including services for phone call assistance; out-patient appointments; and breastfeeding support group. List of other breastfeeding resources in the community given in the handout. Encouraged mother to call for problems or concerns related to breastfeeding. Maternal Data Has patient been taught Hand Expression?: Yes(3-4  drops / LC with gloved finger massage baby's tongue and gums ) Does the patient have breastfeeding experience prior to this delivery?: No  Feeding Feeding Type: Breast Fed  LATCH Score Latch: Repeated attempts needed to sustain latch, nipple held in mouth throughout feeding, stimulation needed to elicit sucking reflex.  Audible Swallowing: None  Type of Nipple: Everted at rest and after stimulation  Comfort (Breast/Nipple): Soft / non-tender  Hold (Positioning): Assistance needed to correctly position infant at breast and maintain latch.  LATCH Score: 6  Interventions Interventions: Breast feeding basics reviewed;Assisted with latch;Skin to skin;Breast massage;Hand express;Adjust position;Support pillows;Position options;Expressed milk  Lactation Tools Discussed/Used WIC Program: No   Consult Status Consult Status: Follow-up Date: 02/09/18 Follow-up type: In-patient    [place] 02/09/2018, 5:59 PM

## 2018-02-09 NOTE — Anesthesia Procedure Notes (Signed)
Epidural Patient location during procedure: OB Start time: 02/09/2018 2:00 AM End time: 02/09/2018 2:15 AM  Staffing Anesthesiologist: Freddrick March, MD Performed: anesthesiologist   Preanesthetic Checklist Completed: patient identified, pre-op evaluation, timeout performed, IV checked, risks and benefits discussed and monitors and equipment checked  Epidural Patient position: sitting Prep: site prepped and draped and DuraPrep Patient monitoring: continuous pulse ox, blood pressure, heart rate and cardiac monitor Approach: midline Location: L3-L4 Injection technique: LOR air  Needle:  Needle type: Tuohy  Needle gauge: 17 G Needle length: 9 cm Needle insertion depth: 6 cm Catheter type: closed end flexible Catheter size: 19 Gauge Catheter at skin depth: 12 cm Test dose: negative  Assessment Sensory level: T8 Events: blood not aspirated, injection not painful, no injection resistance, negative IV test and no paresthesia  Additional Notes Patient identified. Risks/Benefits/Options discussed with patient including but not limited to bleeding, infection, nerve damage, paralysis, failed block, incomplete pain control, headache, blood pressure changes, nausea, vomiting, reactions to medication both or allergic, itching and postpartum back pain. Confirmed with bedside nurse the patient's most recent platelet count. Confirmed with patient that they are not currently taking any anticoagulation, have any bleeding history or any family history of bleeding disorders. Patient expressed understanding and wished to proceed. All questions were answered. Sterile technique was used throughout the entire procedure. Please see nursing notes for vital signs. Test dose was given through epidural catheter and negative prior to continuing to dose epidural or start infusion. Warning signs of high block given to the patient including shortness of breath, tingling/numbness in hands, complete motor block, or  any concerning symptoms with instructions to call for help. Patient was given instructions on fall risk and not to get out of bed. All questions and concerns addressed with instructions to call with any issues or inadequate analgesia.  Reason for block:procedure for pain

## 2018-02-09 NOTE — Anesthesia Preprocedure Evaluation (Signed)
Anesthesia Evaluation  Patient identified by MRN, date of birth, ID band Patient awake    Reviewed: Allergy & Precautions, NPO status , Patient's Chart, lab work & pertinent test results  Airway Mallampati: I  TM Distance: >3 FB Neck ROM: Full    Dental no notable dental hx.    Pulmonary neg pulmonary ROS, former smoker,    Pulmonary exam normal breath sounds clear to auscultation       Cardiovascular negative cardio ROS Normal cardiovascular exam Rhythm:Regular Rate:Normal     Neuro/Psych  Headaches, negative psych ROS   GI/Hepatic negative GI ROS, Neg liver ROS,   Endo/Other  negative endocrine ROS  Renal/GU negative Renal ROS  negative genitourinary   Musculoskeletal negative musculoskeletal ROS (+)   Abdominal   Peds  Hematology negative hematology ROS (+)   Anesthesia Other Findings   Reproductive/Obstetrics negative OB ROS                             Anesthesia Physical Anesthesia Plan  ASA: II  Anesthesia Plan: Epidural   Post-op Pain Management:    Induction:   PONV Risk Score and Plan: Treatment may vary due to age or medical condition  Airway Management Planned: Natural Airway  Additional Equipment:   Intra-op Plan:   Post-operative Plan:   Informed Consent: I have reviewed the patients History and Physical, chart, labs and discussed the procedure including the risks, benefits and alternatives for the proposed anesthesia with the patient or authorized representative who has indicated his/her understanding and acceptance.     Plan Discussed with: Anesthesiologist  Anesthesia Plan Comments: (Patient identified. Risks, benefits, options discussed with patient including but not limited to bleeding, infection, nerve damage, paralysis, failed block, incomplete pain control, headache, blood pressure changes, nausea, vomiting, reactions to medication, itching, and post  partum back pain. Confirmed with bedside nurse the patient's most recent platelet count. Confirmed with the patient that they are not taking any anticoagulation, have any bleeding history or any family history of bleeding disorders. Patient expressed understanding and wishes to proceed. All questions were answered. )        Anesthesia Quick Evaluation

## 2018-02-09 NOTE — Anesthesia Postprocedure Evaluation (Signed)
Anesthesia Post Note  Patient: Kayla Hayden  Procedure(s) Performed: AN AD HOC LABOR EPIDURAL     Patient location during evaluation: Mother Baby Anesthesia Type: Epidural Level of consciousness: awake and alert Pain management: pain level controlled Vital Signs Assessment: post-procedure vital signs reviewed and stable Respiratory status: spontaneous breathing, nonlabored ventilation and respiratory function stable Cardiovascular status: stable Postop Assessment: no headache, no backache, epidural receding, no apparent nausea or vomiting, patient able to bend at knees, able to ambulate and adequate PO intake Anesthetic complications: no    Last Vitals:  Vitals:   02/09/18 0526 02/09/18 0615  BP: (!) 154/94 (!) 137/96  Pulse: 85 95  Resp: 20 18  Temp: 36.7 C   SpO2:  98%    Last Pain:  Vitals:   02/09/18 0526  TempSrc: Oral  PainSc: 2    Pain Goal:                 Jabier Mutton

## 2018-02-09 NOTE — Consult Note (Signed)
Neonatology Note:   Attendance at Delivery:    I was asked by Dr. Terri Piedra to check this infant, born by NSVD at term, due to grunting and retracting, at 11 minutes of age. The mother is a G1P0 B pos, GBS neg with IOL for gestational HTN, fibroids, and migraine headaches. ROM just prior to delivery, fluid clear. Precipitous labor and delivery. Infant vigorous with good spontaneous cry and tone, but bagan to have tachypnea, grunting, and retracting while skin to skin, per nurses. I arrived at about 13 minutes of life, at which time the baby had O2 saturations of 96% in room air (he had always had normal O2 sats and did not require supplemental O2). He was crying; after becoming quiet, he had mild intermittent expiratory grunting, but no retractions or tachypnea. Lungs clear to ausc, no murmurs, excellent perfusion, normal tone, no caput or cephalohematoma. We observed the baby for a few minutes and he maintained normal O2 sats and exam as above. Infant is able to remain with his mother for skin to skin time under nursing supervision. I asked the nurses to either keep him on pulse oximetry or to spot check it frequently over the next hour, and to let me know if the grunting persists/worsens, or if he requires supplemental O2. I spoke with his parents and let them know that he might need to come to NICU if the mild distress worsens or persists, as he might not feed well. Transferred to the care of Pediatrician.   Real Cons, MD

## 2018-02-09 NOTE — Progress Notes (Signed)
Patient ID: Kayla Hayden, female   DOB: 19-Aug-1985, 33 y.o.   MRN: [id number] Pt doing well with no complaints. Occasional cramping as expected. No fever or chills. Lochia moderate. Bonding well with baby - breastfeeding  VS- 140/94 ( 137-154/94-96) ABD - FF EXT - no homans  A/P: PPD#0 - stable         Recehck BP in two hours and if still elevated consider starting on antihypertensive medication          Routine pp care

## 2018-02-09 NOTE — Progress Notes (Signed)
Patient ID: Kayla Hayden, female   DOB: Feb 12, 1985, 33 y.o.   MRN: [id number] Late entry After one dose of cytotec, pt contracted q 1-2 minutes. No further intervention applied. Pt received iv stadol and then fentanyl for pain control. She progressed to 5 cm and then received an epidural. After receiving epidural, pt noted to be 8cm dilated. She labored to complete quickly after with a bulging amniotic sac at introitus. This was ruptured with clear fluid noted and pt noted to be completely dilated.  BP stable CAT 2 - baseline 110s, good variability  Pt prepared for pushing.

## 2018-02-10 NOTE — Progress Notes (Signed)
CSW acknowledges consult.  CSW attempted to meet with MOB, however MOB had several room guest.  CSW will attempt to visit with MOB at a later time.   Laurey Arrow, MSW, LCSW Clinical Social Work [phone]

## 2018-02-10 NOTE — Progress Notes (Signed)
CSW received consult for hx of Anxiety and Depression.  CSW met with MOB to offer support and complete assessment.    When CSW arrived, MOB was resting in bed and MGM was holding infant.  CSW explained CSW's role and MOB gave CSW permission to complete the assessment while MGM was present. MOB appeared flat but was receptive to meeting with CSW.  CSW asked about MOB's MH hx and MOB acknowledged a hx of anxiety/depression and currently not being treated with medication. MOB acknowledged feeling overly anxious on yesterday and attributed her symptoms to a lack of sleep.   CSW provided education regarding the baby blues period vs. perinatal mood disorders, discussed treatment and gave resources for mental health follow up if concerns arise.  CSW recommends self-evaluation during the postpartum time period using the New Mom Checklist from Postpartum Progress and encouraged MOB to contact a medical professional if symptoms are noted at any time.  CSW assessed for safety and MOB denied SI and HI. MOB did not present with any acute symptoms and reported having a good support team.   CSW identifies no further need for intervention and no barriers to discharge at this time.  Laurey Arrow, MSW, LCSW Clinical Social Work [phone]

## 2018-02-10 NOTE — Progress Notes (Signed)
Pt and nurse had a discussion about her hx of anxiety prior to pregnancy. Pt stated she had taken Zoloft for about 3 weeks, then found out she was pregnant and needed to stop taking it. She stated she was "more mellow during pregnancy" and finds herself to be more anxious now d/t increased BP and feeling like she will be in the hospital for a long period of time. She stated that she has no experience with babies and is also nervous about caring for her baby, so we discussed baby safety and risks for shaken baby, as well as swaddling and letting someone else hold baby when she is feeling overwhelmed. Pt also does not want to take scheduled labetalol until she has eaten breakfast. She says she has a sensitivity to medicines and is afraid she will vomit.

## 2018-02-10 NOTE — Progress Notes (Signed)
Pt BP 136/91; pt had become anxious after feeding baby and baby started crying, she states she is also very tired.  FOB at bedside to console infant. Mother is noticeably anxious, so infant taken to nursery for newborn screen and for mother to rest.

## 2018-02-10 NOTE — Lactation Note (Signed)
This note was copied from a baby's chart. Lactation Consultation Note  Patient Name: Kayla Hayden [id number] Date: 02/10/2018   Entered room and dad changing infant and he is very upset.  Inquired about feeding today.  Mom reports that he really has not feed much.  Did a few drops of colosotrum earlier but has not fed.  Infant upset. Asked dad if I could take him.  I did and laid him tummy to tummy with mom.  He will not latch/trying to latch/comes off and on breast crying.  Assisted to latch on othwer breast in cradle hold.  Infant latched and breastfed well with a few audible swallows heard. Gave parents early feeding cues handouts.  Urged to offer the breast every 2 hours or earlier if he shows cues.  Urged hand express and spoon feed back all expressed breastmilk past bf.  Did not discuss pumping at this time.  Parents seemed a little over whelmed with his crying. Urged to make sure and wake him for feeds and spoon fed if he doesn;t latch.  Urged mom to follow up with lactation as needed. Maternal Data    Feeding Feeding Type: Breast Fed  LATCH Score Latch: (mom to call with next feeding )                 Interventions    Lactation Tools Discussed/Used     Consult Status      Kayla Hayden 02/10/2018, 12:11 AM

## 2018-02-10 NOTE — Progress Notes (Signed)
Patient ID: Kayla Hayden, female   DOB: May 02, 1985, 33 y.o.   MRN: [id number] Pt doing well with no complaints. Denies HA or blurry vision. No CP or SOB. Bonding well with baby. Pain well controlled. Lochia mild.  VS 132-136/91 ABD - soft, NT, FF EXT - no edema or homans  11.1>11.1<163  A/P: PPD#1 s/p svd - stable          BP improved on one dose of labetalol; will monitor today and see if needs dose tonight         Routine pp care         Likely discharge to home tomorrow and likely circumcision for baby tomorrow prior to discharge

## 2018-02-11 MED ORDER — LABETALOL HCL 100 MG PO TABS: 100.0000 mg | ORAL_TABLET | Freq: Three times a day (TID) | ORAL | 0 refills | Status: DC

## 2018-02-11 MED ORDER — LABETALOL HCL 100 MG PO TABS: 100.0000 mg | ORAL_TABLET | Freq: Three times a day (TID) | ORAL | Status: DC

## 2018-02-11 MED ORDER — LABETALOL HCL 100 MG PO TABS
100.0000 mg | ORAL_TABLET | Freq: Two times a day (BID) | ORAL | Status: DC
  Administered 2018-02-11: 100 mg via ORAL
  Filled 2018-02-11: qty 1

## 2018-02-11 MED ORDER — IBUPROFEN 600 MG PO TABS: 600.0000 mg | ORAL_TABLET | Freq: Four times a day (QID) | ORAL | 0 refills | Status: DC

## 2018-02-11 NOTE — Progress Notes (Signed)
Parent request formula to supplement breast feeding due to difficulty sustaining latch and mother's fatigue. Parents have been informed of small tummy size of newborn, taught hand expression and understand the possible consequences of formula to the health of the infant. The possible consequences shared with patient include 1) Loss of confidence in breastfeeding 2) Engorgement 3) Allergic sensitization of baby(asthma/allergies) and 4) decreased milk supply for mother.After discussion of the above the mother decided to supplement with formula.  The tool used to give formula supplement will be slow flow nipple.

## 2018-02-11 NOTE — Discharge Summary (Signed)
OB Discharge Summary     Patient Name: Kayla Hayden DOB: 1985/10/30 MRN: [id number]  Date of admission: 02/08/2018 Delivering MD: Carlynn Purl [facility]   Date of discharge: 02/11/2018  Admitting diagnosis: 40WKS HIGH BLOOD PRESSURE Intrauterine pregnancy: [redacted]w[redacted]d     Secondary diagnosis:  Active Problems:   Hypertension affecting pregnancy   SVD (spontaneous vaginal delivery)   Postpartum care following vaginal delivery  Additional problems: none     Discharge diagnosis: Term Pregnancy Delivered                                                                                                Post partum procedures:antihypertensives  Augmentation: AROM and Cytotec  Complications: None  Hospital course:  Induction of Labor With Vaginal Delivery   33 y.o. yo G1P1001 at 108w1d was admitted to the hospital 02/08/2018 for induction of labor.  Indication for induction: Gestational hypertension.  Patient had an uncomplicated labor course as follows: Membrane Rupture Time/Date: 2:54 AM ,02/09/2018   Intrapartum Procedures: Episiotomy: None [1]                                         Lacerations:  1st degree [2];Vaginal [6]  Patient had delivery of a Viable infant.  Information for the patient's newborn:  Lourdes, Kucharski [[id number]]  Delivery Method: Vag-Spont   02/09/2018  Details of delivery can be found in separate delivery note.  Patient had a routine postpartum course. Patient is discharged home 02/11/18.  Physical exam  Vitals:   02/11/18 0100 02/11/18 0400 02/11/18 0645 02/11/18 0816  BP: 120/87 (!) 135/93 132/90 129/90  Pulse: 80 76 74 88  Resp: 18 18 18    Temp: 98.4 F (36.9 C) 98.2 F (36.8 C) 98.6 F (37 C)   TempSrc: Oral Oral Oral   SpO2:      Weight:      Height:       General: alert and cooperative Lochia: appropriate Uterine Fundus: firm  Labs: Lab Results  Component Value Date   WBC 11.1 (H) 02/09/2018   HGB 11.1 (L) 02/09/2018   HCT 33.3 (L)  02/09/2018   MCV 93.5 02/09/2018   PLT 163 02/09/2018   CMP Latest Ref Rng & Units 02/08/2018  Glucose 70 - 99 mg/dL 82  BUN 6 - 20 mg/dL 10  Creatinine 0.44 - 1.00 mg/dL 0.48  Sodium 135 - 145 mmol/L 135  Potassium 3.5 - 5.1 mmol/L 3.8  Chloride 98 - 111 mmol/L 108  CO2 22 - 32 mmol/L 18(L)  Calcium 8.9 - 10.3 mg/dL 8.6(L)  Total Protein 6.5 - 8.1 g/dL 6.6  Total Bilirubin 0.3 - 1.2 mg/dL 0.5  Alkaline Phos 38 - 126 U/L 98  AST 15 - 41 U/L 39  ALT 0 - 44 U/L 45(H)    Discharge instruction: per After Visit Summary and "Baby and Me Booklet".  After visit meds:  Allergies as of 02/11/2018   No Known Allergies     Medication List  STOP taking these medications   BONJESTA 20-20 MG Tbcr Generic drug:  Doxylamine-Pyridoxine ER     TAKE these medications   acetaminophen 500 MG tablet Commonly known as:  TYLENOL Take 1,000 mg by mouth every 6 (six) hours as needed for headache.   ibuprofen 600 MG tablet Commonly known as:  ADVIL,MOTRIN Take 1 tablet (600 mg total) by mouth every 6 (six) hours.   labetalol 100 MG tablet Commonly known as:  NORMODYNE Take 1 tablet (100 mg total) by mouth 3 (three) times daily.   multivitamin-prenatal 27-0.8 MG Tabs tablet Take 1 tablet by mouth daily at 12 noon.       Diet: routine diet  Activity: Advance as tolerated. Pelvic rest for 6 weeks.   Outpatient follow up:3 days Follow up Appt: Future Appointments  Date Time Provider [place]  07/10/2018 11:00 AM Lomax, Amy, NP [facility] None   Follow up Visit:No follow-ups on file.  Postpartum contraception: Condoms  Newborn Data: Live born female  Birth Weight: 7 lb 9.2 oz (3435 g) APGAR: 8, 9  Newborn Delivery   Birth date/time:  02/09/2018 03:09:00 Delivery type:  Vaginal, Spontaneous     Baby Feeding: Breast Disposition:home with mother Pt plans circumcision in the office  02/11/2018 Logan Bores, MD

## 2018-02-11 NOTE — Lactation Note (Signed)
This note was copied from a baby's chart. Lactation Consultation Note  Patient Name: Kayla Hayden [id number] Date: 02/11/2018 Reason for consult: Follow-up assessment Mom states that baby is latching well to right breast but unable to latch on left.  Baby was supplemented once during the night with 30 mls of formula.  Mom has several questions and states she still feels awkward with positioning, burping, etc.  Reassurance given and teaching done.  Questions answered.  Assisted with positioning baby in football hold on the left side.  Baby latched easily and fed actively for 15 minutes.  Mom comfortable.  Assisted with burping and then cross cradle on the right.  Baby latched with ease and fed another 15 minutes.  Mom states she feels better about feeding.  Discussed milk coming to volume and the prevention and treatment of engorgement.  She has a DEBP for home use.  Lactation outpatient services and support reviewed and encouraged prn.  Maternal Data    Feeding Feeding Type: Breast Fed  LATCH Score Latch: Grasps breast easily, tongue down, lips flanged, rhythmical sucking.  Audible Swallowing: A few with stimulation  Type of Nipple: Everted at rest and after stimulation  Comfort (Breast/Nipple): Soft / non-tender  Hold (Positioning): Assistance needed to correctly position infant at breast and maintain latch.  LATCH Score: 8  Interventions Interventions: Breast feeding basics reviewed;Assisted with latch;Breast compression;Skin to skin;Adjust position;Breast massage;Support pillows;Hand express;Position options  Lactation Tools Discussed/Used     Consult Status Consult Status: Complete Follow-up type: Call as needed    Ave Filter 02/11/2018, 12:18 PM

## 2018-02-11 NOTE — Progress Notes (Signed)
Patient ID: Kayla Hayden, female   DOB: 1985/03/04, 33 y.o.   MRN: [id number] Chart check:  BP stable on labetalol. Noted lapse in time of medication administration - recommend bid but specifically q 12 hours apart. Next dose at 7am then pt to continue at 7pm at home

## 2018-02-11 NOTE — Progress Notes (Signed)
Post Partum Day 2 Subjective: no complaints, up ad lib and tolerating PO  Still working on nursing Pt states has not taken her labetalol this AM because wants to take with food  Objective: Blood pressure 129/90, pulse 88, temperature 98.6 F (37 C), temperature source Oral, resp. rate 18, height 5\' 4"  (1.626 m), weight 81.9 kg, SpO2 98 %, unknown if currently breastfeeding.  Physical Exam:  General: alert and cooperative Lochia: appropriate Uterine Fundus: firm   Recent Labs    02/09/18 0506 02/09/18 0657  HGB 12.1 11.1*  HCT 36.7 33.3*    Assessment/Plan:   Discharge home on labetalol BP still slightly elevated on BID labetalol so will increase to TID (pt requests this over increasing to 200mg  po BID) and return to office in 3-4 days for BP check Pt states has decided on office circumcision   LOS: 3 days   Logan Bores 02/11/2018, 9:03 AM

## 2018-02-11 NOTE — Progress Notes (Signed)
Patient refused her 0700 Labetalol until she ate breakfast. B/P 129/90 at 0816. MD made aware.

## 2018-02-13 ENCOUNTER — Encounter (HOSPITAL_COMMUNITY): Payer: Self-pay

## 2018-03-22 DIAGNOSIS — Z1389 Encounter for screening for other disorder: Secondary | ICD-10-CM | POA: Diagnosis not present

## 2018-03-22 DIAGNOSIS — D259 Leiomyoma of uterus, unspecified: Secondary | ICD-10-CM | POA: Diagnosis not present

## 2018-03-22 DIAGNOSIS — Z3009 Encounter for other general counseling and advice on contraception: Secondary | ICD-10-CM | POA: Diagnosis not present

## 2018-07-02 ENCOUNTER — Telehealth: Payer: Self-pay

## 2018-07-02 NOTE — Telephone Encounter (Signed)
Spoke with the patient and they have given verbal consent to file their insurance and to do a doxy.me visit. E-mail, mobile number and carrier have been confirmed and sent.  E-mail: [email or web address] Text: [phone] Youth worker)

## 2018-07-10 ENCOUNTER — Encounter: Payer: Self-pay | Admitting: Family Medicine

## 2018-07-10 ENCOUNTER — Ambulatory Visit: Payer: Self-pay | Admitting: Adult Health

## 2018-07-10 ENCOUNTER — Ambulatory Visit (INDEPENDENT_AMBULATORY_CARE_PROVIDER_SITE_OTHER): Payer: BC Managed Care – PPO | Admitting: Family Medicine

## 2018-07-10 ENCOUNTER — Other Ambulatory Visit: Payer: Self-pay

## 2018-07-10 DIAGNOSIS — G43009 Migraine without aura, not intractable, without status migrainosus: Secondary | ICD-10-CM

## 2018-07-10 MED ORDER — METOCLOPRAMIDE HCL 10 MG PO TABS: 10.0000 mg | ORAL_TABLET | Freq: Three times a day (TID) | ORAL | 2 refills | Status: DC

## 2018-07-10 MED ORDER — BUTALBITAL-APAP-CAFFEINE 50-325-40 MG PO TABS: 1.0000 | ORAL_TABLET | Freq: Four times a day (QID) | ORAL | 3 refills | Status: DC | PRN

## 2018-07-10 NOTE — Progress Notes (Signed)
I reviewed note and agree with plan.   Penni Bombard, MD 0/08/2255, 5:05 PM Certified in Neurology, Neurophysiology and Neuroimaging  [facility] [address] [place], [place] [address] [phone]
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
## 2018-07-16 ENCOUNTER — Telehealth: Payer: Self-pay | Admitting: Family Medicine

## 2018-07-16 NOTE — Telephone Encounter (Signed)
Patient would like to start Botox therapy pending insurance approval. Can you help me with this? Thank you!

## 2018-07-16 NOTE — Telephone Encounter (Signed)
I called to schedule the patient for Botox injections but she did not answer so I left her a VM. DW

## 2018-07-24 ENCOUNTER — Encounter: Payer: Self-pay | Admitting: Family Medicine

## 2018-07-24 NOTE — Telephone Encounter (Signed)
Pt has called back and the message was read to her [id number] insurance is requesting that she try a CGRP before approving Botox.  Pt is asking for a call back to discuss

## 2018-07-24 NOTE — Telephone Encounter (Signed)
Noted, thank you

## 2018-07-24 NOTE — Telephone Encounter (Signed)
Patient's insurance is requesting that she try a CGRP before approving Botox. Please advise.

## 2018-07-24 NOTE — Telephone Encounter (Signed)
I would have her reach out to her insurance for details regarding requirements prior to trying Botox. Kayla Hayden may can help her with cash pay price with Botox.

## 2018-07-24 NOTE — Telephone Encounter (Signed)
I called and spoke with the patient regarding her Botox. I advised that she would need to be on the CGRP for 3 months before it would be considered a tried and failed medication by her insurance company. I also gave her a price estimate for her out of pocket cost for Botox. We also discussed the reimbursement program through [employer] and I advised her that this program may not work if Botox is not covered through her insurance but advised her that she can call and inquire with them and went over the reimbursement details.   The patient is considering the Aimovig injection but would like to speak with Amy NP regarding side effects and some things she has read about the medication while doing research. Please call and advise.

## 2018-07-24 NOTE — Telephone Encounter (Signed)
Thank you Danielle. I will have [place] call her. Please call her and let her know. She was told this may be a request. We discussed trying Amovig as well as side effects and proper administration. Is she willing? If so I will call it in.

## 2018-07-24 NOTE — Telephone Encounter (Signed)
Spoke with the patient and she stated that she is a little hesitant to taking Aimovig because she has heard awful things about it and she is sensitive to medications. She also wanted to know how long would she have to be on Aimovig before insurance pays for it? She mentioned that she wanted to explore all options and she is even ok with paying for botox out of pocket if it comes down to it. Please advise.

## 2018-07-31 ENCOUNTER — Ambulatory Visit: Payer: BC Managed Care – PPO | Admitting: Family Medicine

## 2018-08-01 NOTE — Telephone Encounter (Signed)
I called and spoke with the patient regarding the reimbursement. DW

## 2018-08-01 NOTE — Telephone Encounter (Signed)
Pt has called back because she has not heard back from NP Amy re: questions and concerns that she has about trying the Aimovig injection .  Please call

## 2018-08-01 NOTE — Telephone Encounter (Signed)
I have spoken with patient regarding Amovig and other CGRP medications including side effects and drug interactions. She is currently breastfeeding and is not sure she is comfortable starting CGRP. Andee Poles can you help me determine if [employer] will accept this as a contraindication and allow to proceed with Botox? She also requesting contact information for [employer] to discuss reimbursement program.

## 2018-08-01 NOTE — Telephone Encounter (Signed)
I sent request to [facility] with notation of contraindication.

## 2018-08-05 NOTE — Telephone Encounter (Signed)
Spoke with the patient and she stated that she will be breast feeding for the next 6 months. She stated she will discuss using Aimovig while breast feeding with the pediatrician next week during the appt.

## 2018-08-05 NOTE — Telephone Encounter (Signed)
Can you notify patient of denial from insurance? We can try CGRP once she is no longer breastfeeding. Please ask if she has reached out to pediatrician regarding Amovig while breastfeeding. We will have to weight risk and benefits.

## 2018-08-05 NOTE — Telephone Encounter (Signed)
Request for Botox coverage was denied due to failure to try CGRP. They would not accept the contraindication of the CGRP for breastfeeding.

## 2018-10-17 IMAGING — CT CT HEAD W/O CM
3 of 4 series · 16 of 47 positions shown, 19 images · non-contrast
Comparison: None.

CLINICAL DATA: Frontal and right retro-orbital headache for 2 days.
Nausea and photophobia.

EXAM:
CT HEAD WITHOUT CONTRAST
TECHNIQUE: Contiguous axial images were obtained from the base of the skull
through the vertex without intravenous contrast.

[Series 2: head w/o · axial · non-contrast · 0.45mm/px · z∈[-236,-116]mm · 10 of 29 slices shown, 13 images]
[im 3/29  brain]
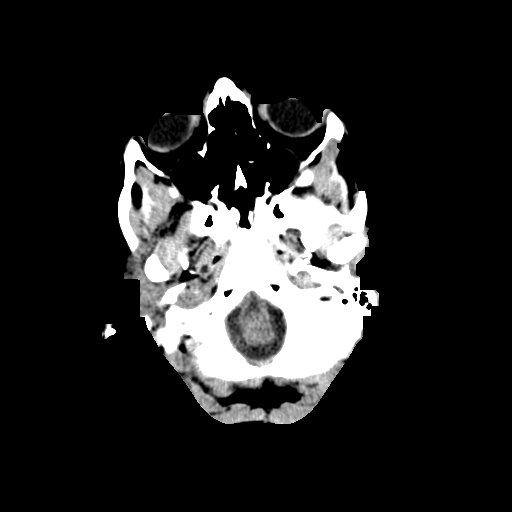
[im 3/29  bone]
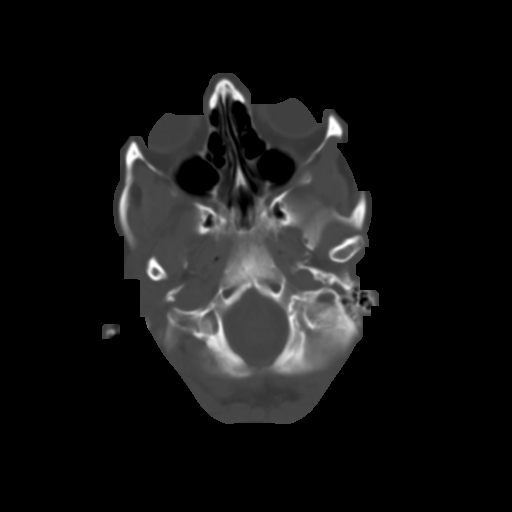
[im 5/29  brain]
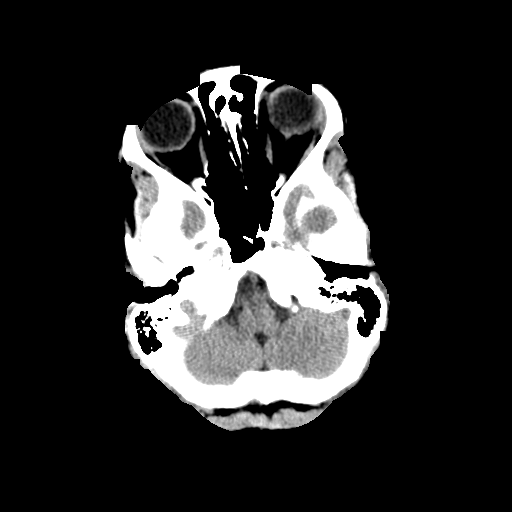
[im 9/29  brain]
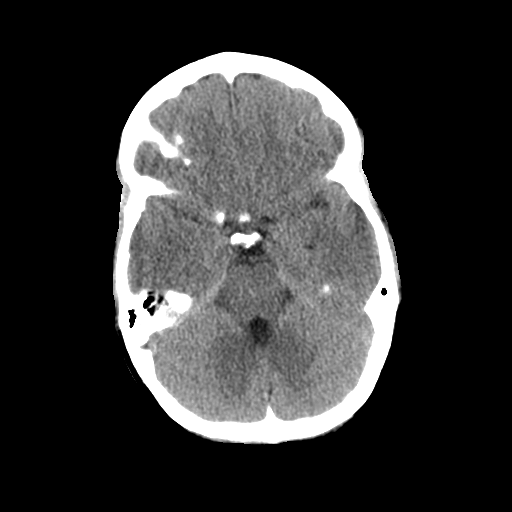
[im 11/29  brain]
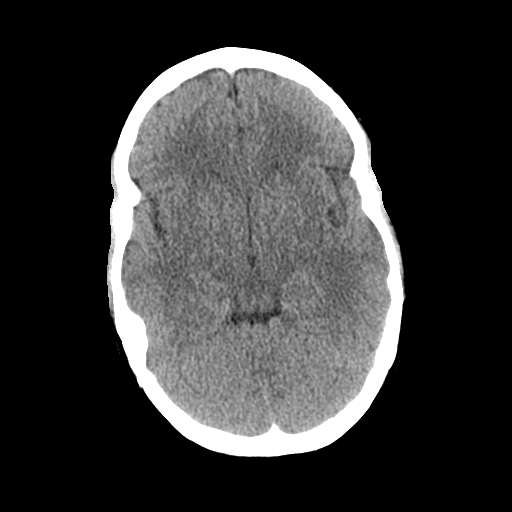
[im 13/29  brain]
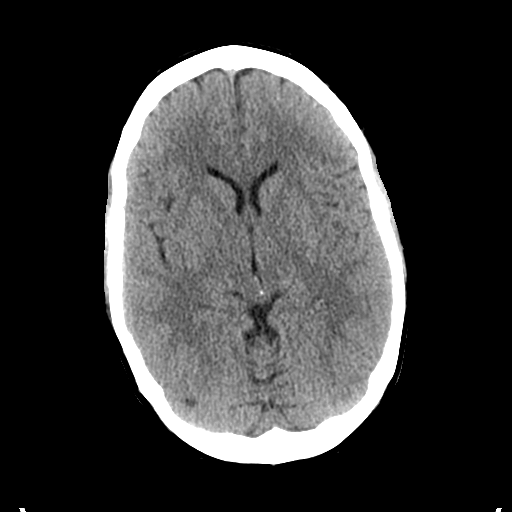
[im 13/29  bone]
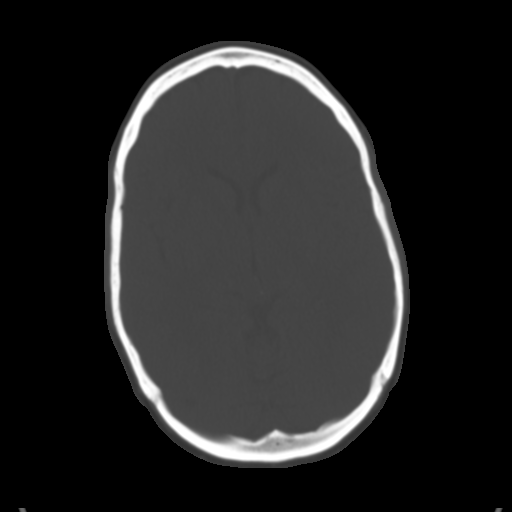
[im 17/29  brain]
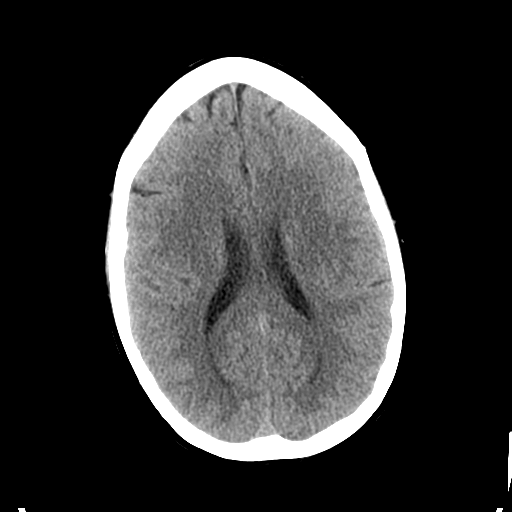
[im 19/29  brain]
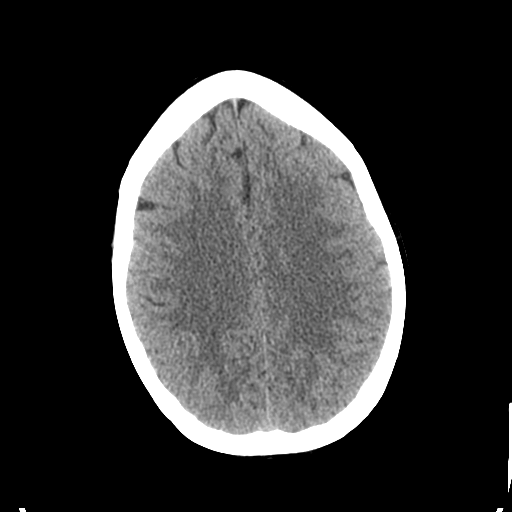
[im 21/29  brain]
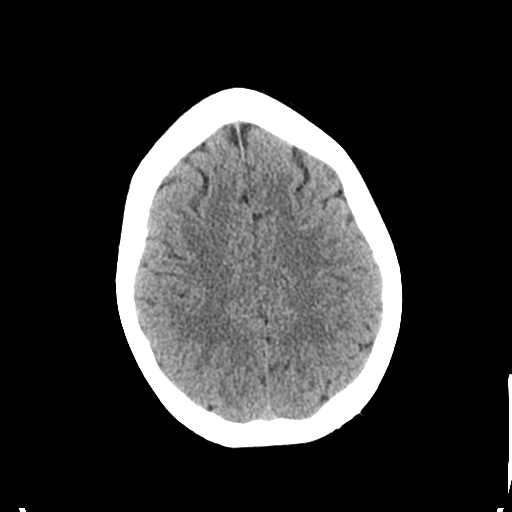
[im 25/29  brain]
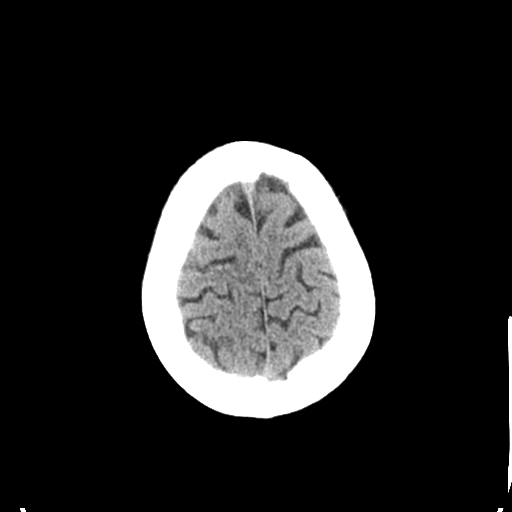
[im 25/29  bone]
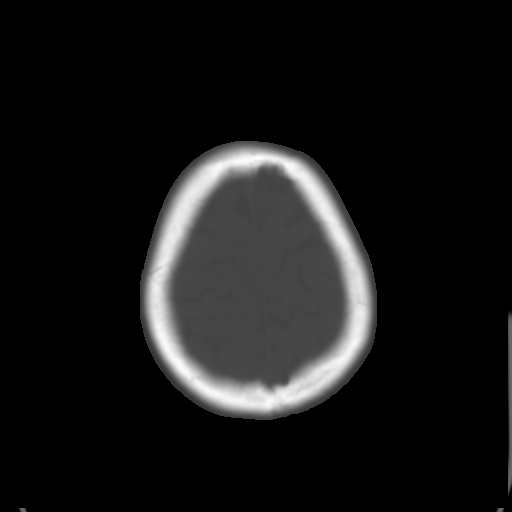
[im 27/29  brain]
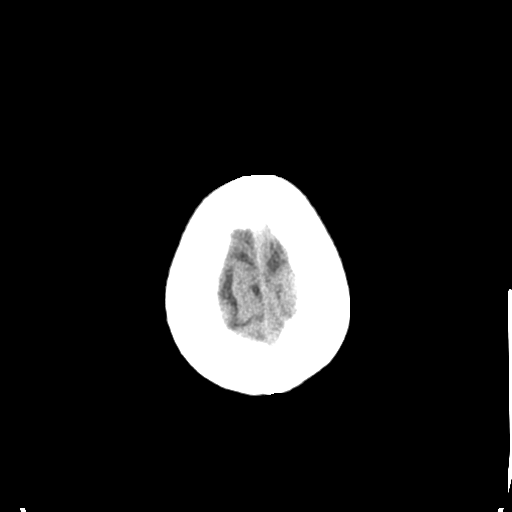

[Series 4: coronal · coronal · 0.28mm/px · 3 of 63 slices shown]
[im 21/63  brain]
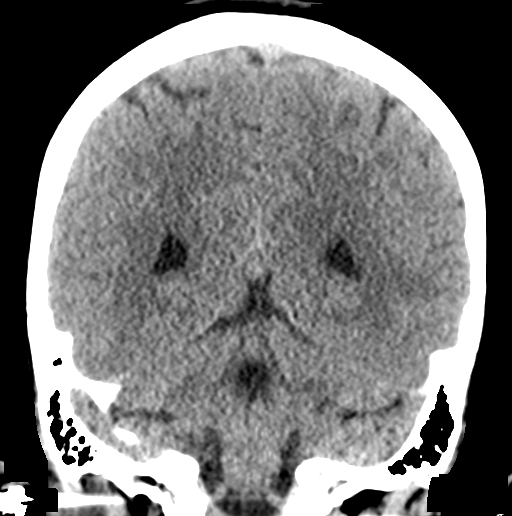
[im 28/63  brain]
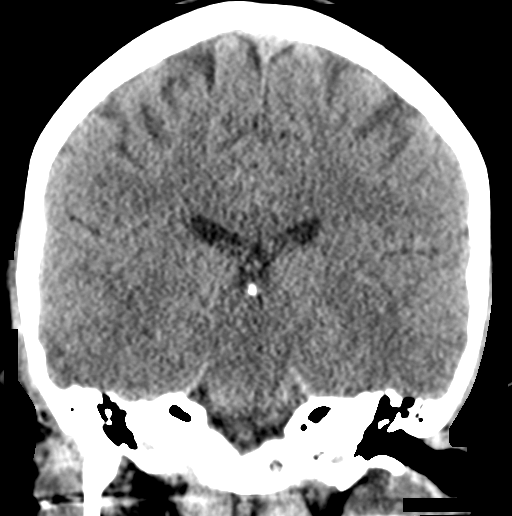
[im 35/63  brain]
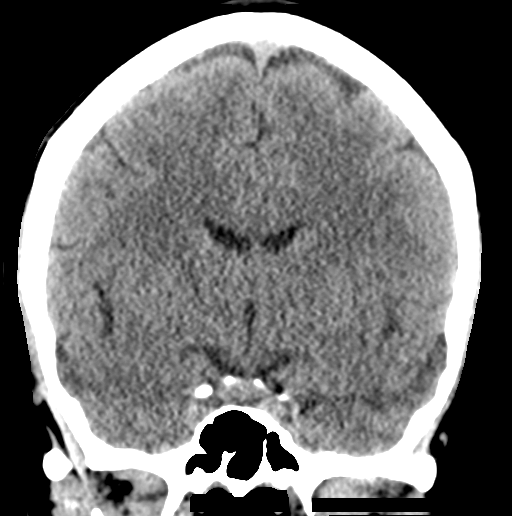

[Series 5: sagittal · sagittal · 0.29mm/px · 3 of 45 slices shown]
[im 15/45  brain]
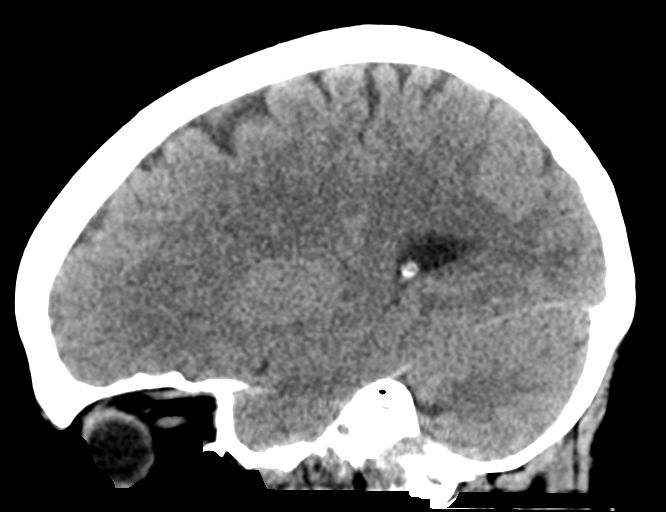
[im 23/45  brain]
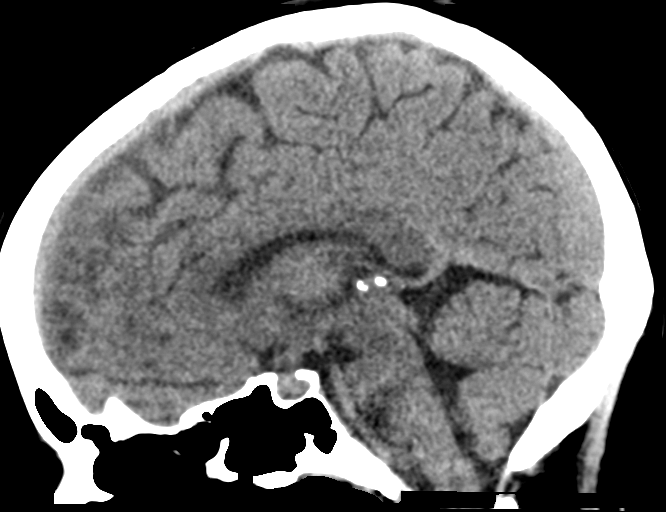
[im 30/45  brain]
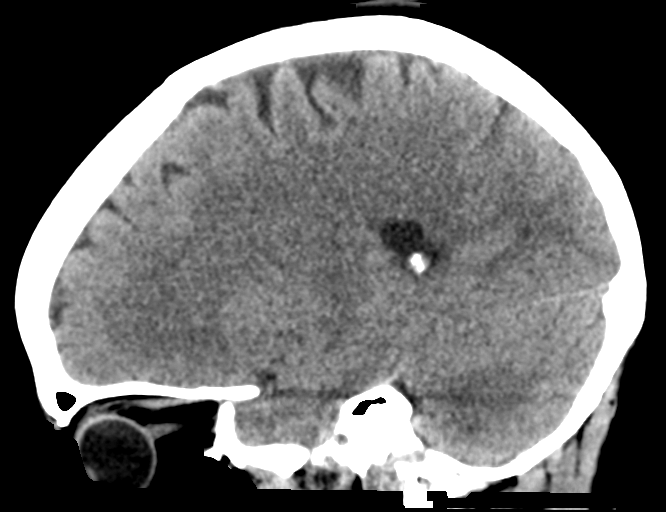

[16 of 47 positions shown; findings below may reference images not displayed]

FINDINGS: Brain: There is no intracranial hemorrhage, mass or evidence of
acute infarction. There is no extra-axial fluid collection. Gray
matter and white matter appear normal. Cerebral volume is normal for
age. Brainstem and posterior fossa are unremarkable. The CSF spaces
appear normal.

Vascular: No hyperdense vessel or unexpected calcification.

Skull: Normal. Negative for fracture or focal lesion.

Sinuses/Orbits: No acute finding.

Other: None.
IMPRESSION: Normal brain
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
## 2018-11-05 NOTE — Patient Instructions (Addendum)
We will change abortive medication to rizatriptan. Avoid breastfeeding for 24 hours following dose if any noted side effects (sleepiness, irritability) with baby   ondansetron for nausea as needed  Follow up in 1 year    Migraine Headache A migraine headache is a very strong throbbing pain on one side or both sides of your head. This type of headache can also cause other symptoms. It can last from 4 hours to 3 days. Talk with your doctor about what things may bring on (trigger) this condition. What are the causes? The exact cause of this condition is not known. This condition may be triggered or caused by:  Drinking alcohol.  Smoking.  Taking medicines, such as: ? Medicine used to treat chest pain (nitroglycerin). ? Birth control pills. ? Estrogen. ? Some blood pressure medicines.  Eating or drinking certain products.  Doing physical activity. Other things that may trigger a migraine headache include:  Having a menstrual period.  Pregnancy.  Hunger.  Stress.  Not getting enough sleep or getting too much sleep.  Weather changes.  Tiredness (fatigue). What increases the risk?  Being 61-46 years old.  Being female.  Having a family history of migraine headaches.  Being Caucasian.  Having depression or anxiety.  Being very overweight. What are the signs or symptoms?  A throbbing pain. This pain may: ? Happen in any area of the head, such as on one side or both sides. ? Make it hard to do daily activities. ? Get worse with physical activity. ? Get worse around bright lights or loud noises.  Other symptoms may include: ? Feeling sick to your stomach (nauseous). ? Vomiting. ? Dizziness. ? Being sensitive to bright lights, loud noises, or smells.  Before you get a migraine headache, you may get warning signs (an aura). An aura may include: ? Seeing flashing lights or having blind spots. ? Seeing bright spots, halos, or zigzag lines. ? Having tunnel  vision or blurred vision. ? Having numbness or a tingling feeling. ? Having trouble talking. ? Having weak muscles.  Some people have symptoms after a migraine headache (postdromal phase), such as: ? Tiredness. ? Trouble thinking (concentrating). How is this treated?  Taking medicines that: ? Relieve pain. ? Relieve the feeling of being sick to your stomach. ? Prevent migraine headaches.  Treatment may also include: ? Having acupuncture. ? Avoiding foods that bring on migraine headaches. ? Learning ways to control your body functions (biofeedback). ? Therapy to help you know and deal with negative thoughts (cognitive behavioral therapy). Follow these instructions at home: Medicines  Take over-the-counter and prescription medicines only as told by your doctor.  Ask your doctor if the medicine prescribed to you: ? Requires you to avoid driving or using heavy machinery. ? Can cause trouble pooping (constipation). You may need to take these steps to prevent or treat trouble pooping:  Drink enough fluid to keep your pee (urine) pale yellow.  Take over-the-counter or prescription medicines.  Eat foods that are high in fiber. These include beans, whole grains, and fresh fruits and vegetables.  Limit foods that are high in fat and sugar. These include fried or sweet foods. Lifestyle  Do not drink alcohol.  Do not use any products that contain nicotine or tobacco, such as cigarettes, e-cigarettes, and chewing tobacco. If you need help quitting, ask your doctor.  Get at least 8 hours of sleep every night.  Limit and deal with stress. General instructions      Keep  a journal to find out what may bring on your migraine headaches. For example, write down: ? What you eat and drink. ? How much sleep you get. ? Any change in what you eat or drink. ? Any change in your medicines.  If you have a migraine headache: ? Avoid things that make your symptoms worse, such as bright  lights. ? It may help to lie down in a dark, quiet room. ? Do not drive or use heavy machinery. ? Ask your doctor what activities are safe for you.  Keep all follow-up visits as told by your doctor. This is important. Contact a doctor if:  You get a migraine headache that is different or worse than others you have had.  You have more than 15 headache days in one month. Get help right away if:  Your migraine headache gets very bad.  Your migraine headache lasts longer than 72 hours.  You have a fever.  You have a stiff neck.  You have trouble seeing.  Your muscles feel weak or like you cannot control them.  You start to lose your balance a lot.  You start to have trouble walking.  You pass out (faint).  You have a seizure. Summary  A migraine headache is a very strong throbbing pain on one side or both sides of your head. These headaches can also cause other symptoms.  This condition may be treated with medicines and changes to your lifestyle.  Keep a journal to find out what may bring on your migraine headaches.  Contact a doctor if you get a migraine headache that is different or worse than others you have had.  Contact your doctor if you have more than 15 headache days in a month. This information is not intended to replace advice given to you by your health care provider. Make sure you discuss any questions you have with your health care provider. Document Released: 11/02/2007 Document Revised: 05/17/2018 Document Reviewed: 03/07/2018 Elsevier Patient Education  [place].   Rizatriptan disintegrating tablets What is this medicine? RIZATRIPTAN (rye za TRIP tan) is used to treat migraines with or without aura. An aura is a strange feeling or visual disturbance that warns you of an attack. It is not used to prevent migraines. This medicine may be used for other purposes; ask your health care provider or pharmacist if you have questions. COMMON BRAND  NAME(S): Maxalt-MLT What should I tell my health care provider before I take this medicine? They need to know if you have any of these conditions:  cigarette smoker  circulation problems in fingers and toes  diabetes  heart disease  high blood pressure  high cholesterol  history of irregular heartbeat  history of stroke  kidney disease  liver disease  stomach or intestine problems  an unusual or allergic reaction to rizatriptan, other medicines, foods, dyes, or preservatives  pregnant or trying to get pregnant  breast-feeding How should I use this medicine? Take this medicine by mouth. Follow the directions on the prescription label. Leave the tablet in the sealed blister pack until you are ready to take it. With dry hands, open the blister and gently remove the tablet. If the tablet breaks or crumbles, throw it away and take a new tablet out of the blister pack. Place the tablet in the mouth and allow it to dissolve, and then swallow. Do not cut, crush, or chew this medicine. You do not need water to take this medicine. Do not take  it more often than directed. Talk to your pediatrician regarding the use of this medicine in children. While this drug may be prescribed for children as young as 6 years for selected conditions, precautions do apply. Overdosage: If you think you have taken too much of this medicine contact a poison control center or emergency room at once. NOTE: This medicine is only for you. Do not share this medicine with others. What if I miss a dose? This does not apply. This medicine is not for regular use. What may interact with this medicine? Do not take this medicine with any of the following medicines:  certain medicines for migraine headache like almotriptan, eletriptan, frovatriptan, naratriptan, rizatriptan, sumatriptan, zolmitriptan  ergot alkaloids like dihydroergotamine, ergonovine, ergotamine, methylergonovine  MAOIs like Carbex, Eldepryl,  Marplan, Nardil, and Parnate This medicine may also interact with the following medications:  certain medicines for depression, anxiety, or psychotic disorders  propranolol This list may not describe all possible interactions. Give your health care provider a list of all the medicines, herbs, non-prescription drugs, or dietary supplements you use. Also tell them if you smoke, drink alcohol, or use illegal drugs. Some items may interact with your medicine. What should I watch for while using this medicine? Visit your healthcare professional for regular checks on your progress. Tell your healthcare professional if your symptoms do not start to get better or if they get worse. You may get drowsy or dizzy. Do not drive, use machinery, or do anything that needs mental alertness until you know how this medicine affects you. Do not stand up or sit up quickly, especially if you are an older patient. This reduces the risk of dizzy or fainting spells. Alcohol may interfere with the effect of this medicine. Your mouth may get dry. Chewing sugarless gum or sucking hard candy and drinking plenty of water may help. Contact your healthcare professional if the problem does not go away or is severe. If you take migraine medicines for 10 or more days a month, your migraines may get worse. Keep a diary of headache days and medicine use. Contact your healthcare professional if your migraine attacks occur more frequently. What side effects may I notice from receiving this medicine? Side effects that you should report to your doctor or health care professional as soon as possible:  allergic reactions like skin rash, itching or hives, swelling of the face, lips, or tongue  chest pain or chest tightness  signs and symptoms of a dangerous change in heartbeat or heart rhythm like chest pain; dizziness; fast, irregular heartbeat; palpitations; feeling faint or lightheaded; falls; breathing problems  signs and symptoms of  a stroke like changes in vision; confusion; trouble speaking or understanding; severe headaches; sudden numbness or weakness of the face, arm or leg; trouble walking; dizziness; loss of balance or coordination  signs and symptoms of serotonin syndrome like irritable; confusion; diarrhea; fast or irregular heartbeat; muscle twitching; stiff muscles; trouble walking; sweating; high fever; seizures; chills; vomiting Side effects that usually do not require medical attention (report to your doctor or health care professional if they continue or are bothersome):  diarrhea  dizziness  drowsiness  dry mouth  headache  nausea, vomiting  pain, tingling, numbness in the hands or feet  stomach pain This list may not describe all possible side effects. Call your doctor for medical advice about side effects. You may report side effects to FDA at 1-800-FDA-1088. Where should I keep my medicine? Keep out of the reach of children.  Store at room temperature between 15 and 30 degrees C (59 and 86 degrees F). Protect from light and moisture. Throw away any unused medicine after the expiration date. NOTE: This sheet is a summary. It may not cover all possible information. If you have questions about this medicine, talk to your doctor, pharmacist, or health care provider.  2020 Elsevier/Gold Standard (2017-08-07 14:58:08)

## 2018-11-06 ENCOUNTER — Ambulatory Visit (INDEPENDENT_AMBULATORY_CARE_PROVIDER_SITE_OTHER): Payer: BC Managed Care – PPO | Admitting: Family Medicine

## 2018-11-06 ENCOUNTER — Other Ambulatory Visit: Payer: Self-pay

## 2018-11-06 ENCOUNTER — Encounter: Payer: Self-pay | Admitting: Family Medicine

## 2018-11-06 VITALS — BP 112/78 | HR 90 | Temp 97.8°F | Ht 64.0 in | Wt 125.0 lb

## 2018-11-06 DIAGNOSIS — G43009 Migraine without aura, not intractable, without status migrainosus: Secondary | ICD-10-CM

## 2018-11-06 MED ORDER — ONDANSETRON HCL 4 MG PO TABS: 4.0000 mg | ORAL_TABLET | Freq: Three times a day (TID) | ORAL | 0 refills | Status: DC | PRN

## 2018-11-06 MED ORDER — RIZATRIPTAN BENZOATE 10 MG PO TBDP: 10.0000 mg | ORAL_TABLET | ORAL | 11 refills | Status: DC | PRN

## 2018-12-02 NOTE — Progress Notes (Signed)
I reviewed note and agree with plan.   Penni Bombard, MD [id number], [id number] AM Certified in Neurology, Neurophysiology and Neuroimaging  [facility] [address] [place],  [address] [phone]

## 2018-12-19 DIAGNOSIS — Z6821 Body mass index (BMI) 21.0-21.9, adult: Secondary | ICD-10-CM | POA: Diagnosis not present

## 2018-12-19 DIAGNOSIS — Z3040 Encounter for surveillance of contraceptives, unspecified: Secondary | ICD-10-CM | POA: Diagnosis not present

## 2018-12-19 DIAGNOSIS — D259 Leiomyoma of uterus, unspecified: Secondary | ICD-10-CM | POA: Diagnosis not present

## 2018-12-19 DIAGNOSIS — D251 Intramural leiomyoma of uterus: Secondary | ICD-10-CM | POA: Diagnosis not present

## 2018-12-19 DIAGNOSIS — Z1389 Encounter for screening for other disorder: Secondary | ICD-10-CM | POA: Diagnosis not present

## 2018-12-19 DIAGNOSIS — Z01419 Encounter for gynecological examination (general) (routine) without abnormal findings: Secondary | ICD-10-CM | POA: Diagnosis not present

## 2019-03-12 ENCOUNTER — Ambulatory Visit: Payer: BC Managed Care – PPO | Admitting: Family Medicine

## 2019-09-15 DIAGNOSIS — J019 Acute sinusitis, unspecified: Secondary | ICD-10-CM | POA: Diagnosis not present

## 2019-09-15 DIAGNOSIS — B349 Viral infection, unspecified: Secondary | ICD-10-CM | POA: Diagnosis not present

## 2019-09-16 DIAGNOSIS — B349 Viral infection, unspecified: Secondary | ICD-10-CM | POA: Diagnosis not present

## 2019-09-16 DIAGNOSIS — Z20822 Contact with and (suspected) exposure to covid-19: Secondary | ICD-10-CM | POA: Diagnosis not present

## 2019-09-19 ENCOUNTER — Telehealth: Payer: Self-pay | Admitting: Family Medicine

## 2019-09-19 NOTE — Telephone Encounter (Signed)
..   Pt understands that although there may be some limitations with this type of visit, we will take all precautions to reduce any security or privacy concerns.  Pt understands that this will be treated like an in office visit and we will file with pt's insurance, and there may be a patient responsible charge related to this service. ? ?

## 2019-09-22 NOTE — Telephone Encounter (Signed)
Noted.  Migraines. 

## 2019-10-20 ENCOUNTER — Telehealth (INDEPENDENT_AMBULATORY_CARE_PROVIDER_SITE_OTHER): Payer: BC Managed Care – PPO | Admitting: Family Medicine

## 2019-10-20 ENCOUNTER — Encounter: Payer: Self-pay | Admitting: Family Medicine

## 2019-10-20 DIAGNOSIS — G43009 Migraine without aura, not intractable, without status migrainosus: Secondary | ICD-10-CM

## 2019-10-20 MED ORDER — PROPRANOLOL HCL 20 MG PO TABS: 20.0000 mg | ORAL_TABLET | Freq: Two times a day (BID) | ORAL | 6 refills | Status: DC

## 2019-10-20 MED ORDER — RIZATRIPTAN BENZOATE 5 MG PO TABS: 5.0000 mg | ORAL_TABLET | ORAL | 0 refills | Status: DC | PRN

## 2019-10-20 NOTE — Patient Instructions (Signed)
We will start propranolol 20mg  twice daily. Take once daily for 1-2 weeks to ensure tolerability. Continue rizatriptan for abortive therapy. I have called in 5mg  tablets so you do not have to cut these in half.   Continue healthy lifestyle habits.   Follow up in 3 months    Propranolol Tablets What is this medicine? PROPRANOLOL (proe PRAN oh lole) is a beta blocker. It decreases the amount of work your heart has to do and helps your heart beat regularly. It treats high blood pressure and/or prevent chest pain (also called angina). It is also used after a heart attack to prevent a second one. This medicine may be used for other purposes; ask your health care provider or pharmacist if you have questions. COMMON BRAND NAME(S): Inderal What should I tell my health care provider before I take this medicine? They need to know if you have any of these conditions:  circulation problems or blood vessel disease  diabetes  history of heart attack or heart disease, vasospastic angina  kidney disease  liver disease  lung or breathing disease, like asthma or emphysema  pheochromocytoma  slow heart rate  thyroid disease  an unusual or allergic reaction to propranolol, other beta-blockers, medicines, foods, dyes, or preservatives  pregnant or trying to get pregnant  breast-feeding How should I use this medicine? Take this drug by mouth. Take it as directed on the prescription label at the same time every day. Keep taking it unless your health care provider tells you to stop. Talk to your health care provider about the use of this drug in children. Special care may be needed. Overdosage: If you think you have taken too much of this medicine contact a poison control center or emergency room at once. NOTE: This medicine is only for you. Do not share this medicine with others. What if I miss a dose? If you miss a dose, take it as soon as you can. If it is almost time for your next dose, take  only that dose. Do not take double or extra doses. What may interact with this medicine? Do not take this medicine with any of the following medications:  feverfew  phenothiazines like chlorpromazine, mesoridazine, prochlorperazine, thioridazine This medicine may also interact with the following medications:  aluminum hydroxide gel  antipyrine  antiviral medicines for HIV or AIDS  barbiturates like phenobarbital  certain medicines for blood pressure, heart disease, irregular heart beat  cimetidine  ciprofloxacin  diazepam  fluconazole  haloperidol  isoniazid  medicines for cholesterol like cholestyramine or colestipol  medicines for mental depression  medicines for migraine headache like almotriptan, eletriptan, frovatriptan, naratriptan, rizatriptan, sumatriptan, zolmitriptan  NSAIDs, medicines for pain and inflammation, like ibuprofen or naproxen  phenytoin  rifampin  teniposide  theophylline  thyroid medicines  tolbutamide  warfarin  zileuton This list may not describe all possible interactions. Give your health care provider a list of all the medicines, herbs, non-prescription drugs, or dietary supplements you use. Also tell them if you smoke, drink alcohol, or use illegal drugs. Some items may interact with your medicine. What should I watch for while using this medicine? Visit your doctor or health care professional for regular check ups. Check your blood pressure and pulse rate regularly. Ask your health care professional what your blood pressure and pulse rate should be, and when you should contact them. You may get drowsy or dizzy. Do not drive, use machinery, or do anything that needs mental alertness until you know how  this drug affects you. Do not stand or sit up quickly, especially if you are an older patient. This reduces the risk of dizzy or fainting spells. Alcohol can make you more drowsy and dizzy. Avoid alcoholic drinks. This medicine may  increase blood sugar. Ask your healthcare provider if changes in diet or medicines are needed if you have diabetes. Do not treat yourself for coughs, colds, or pain while you are taking this medicine without asking your doctor or health care professional for advice. Some ingredients may increase your blood pressure. What side effects may I notice from receiving this medicine? Side effects that you should report to your doctor or health care professional as soon as possible:  allergic reactions like skin rash, itching or hives, swelling of the face, lips, or tongue  breathing problems  cold hands or feet  difficulty sleeping, nightmares  dry peeling skin  hallucinations  muscle cramps or weakness   signs and symptoms of high blood sugar such as being more thirsty or hungry or having to urinate more than normal. You may also feel very tired or have blurry vision.  slow heart rate  swelling of the legs and ankles  vomiting Side effects that usually do not require medical attention (report to your doctor or health care professional if they continue or are bothersome):  change in sex drive or performance  diarrhea  dry sore eyes  hair loss  nausea  weak or tired This list may not describe all possible side effects. Call your doctor for medical advice about side effects. You may report side effects to FDA at 1-800-FDA-1088. Where should I keep my medicine? Keep out of the reach of children and pets. Store at room temperature between 20 and 25 degrees C (68 and 77 degrees F). Protect from light. Throw away any unused drug after the expiration date. NOTE: This sheet is a summary. It may not cover all possible information. If you have questions about this medicine, talk to your doctor, pharmacist, or health care provider.  2020 Elsevier/Gold Standard (2018-08-30 19:25:51)  Rizatriptan disintegrating tablets What is this medicine? RIZATRIPTAN (rye za TRIP tan) is used to treat  migraines with or without aura. An aura is a strange feeling or visual disturbance that warns you of an attack. It is not used to prevent migraines. This medicine may be used for other purposes; ask your health care provider or pharmacist if you have questions. COMMON BRAND NAME(S): Maxalt-MLT What should I tell my health care provider before I take this medicine? They need to know if you have any of these conditions:  cigarette smoker  circulation problems in fingers and toes  diabetes  heart disease  high blood pressure  high cholesterol  history of irregular heartbeat  history of stroke  kidney disease  liver disease  stomach or intestine problems  an unusual or allergic reaction to rizatriptan, other medicines, foods, dyes, or preservatives  pregnant or trying to get pregnant  breast-feeding How should I use this medicine? Take this medicine by mouth. Follow the directions on the prescription label. Leave the tablet in the sealed blister pack until you are ready to take it. With dry hands, open the blister and gently remove the tablet. If the tablet breaks or crumbles, throw it away and take a new tablet out of the blister pack. Place the tablet in the mouth and allow it to dissolve, and then swallow. Do not cut, crush, or chew this medicine. You do not need  water to take this medicine. Do not take it more often than directed. Talk to your pediatrician regarding the use of this medicine in children. While this drug may be prescribed for children as young as 6 years for selected conditions, precautions do apply. Overdosage: If you think you have taken too much of this medicine contact a poison control center or emergency room at once. NOTE: This medicine is only for you. Do not share this medicine with others. What if I miss a dose? This does not apply. This medicine is not for regular use. What may interact with this medicine? Do not take this medicine with any of the  following medicines:  certain medicines for migraine headache like almotriptan, eletriptan, frovatriptan, naratriptan, rizatriptan, sumatriptan, zolmitriptan  ergot alkaloids like dihydroergotamine, ergonovine, ergotamine, methylergonovine  MAOIs like Carbex, Eldepryl, Marplan, Nardil, and Parnate This medicine may also interact with the following medications:  certain medicines for depression, anxiety, or psychotic disorders  propranolol This list may not describe all possible interactions. Give your health care provider a list of all the medicines, herbs, non-prescription drugs, or dietary supplements you use. Also tell them if you smoke, drink alcohol, or use illegal drugs. Some items may interact with your medicine. What should I watch for while using this medicine? Visit your healthcare professional for regular checks on your progress. Tell your healthcare professional if your symptoms do not start to get better or if they get worse. You may get drowsy or dizzy. Do not drive, use machinery, or do anything that needs mental alertness until you know how this medicine affects you. Do not stand up or sit up quickly, especially if you are an older patient. This reduces the risk of dizzy or fainting spells. Alcohol may interfere with the effect of this medicine. Your mouth may get dry. Chewing sugarless gum or sucking hard candy and drinking plenty of water may help. Contact your healthcare professional if the problem does not go away or is severe. If you take migraine medicines for 10 or more days a month, your migraines may get worse. Keep a diary of headache days and medicine use. Contact your healthcare professional if your migraine attacks occur more frequently. What side effects may I notice from receiving this medicine? Side effects that you should report to your doctor or health care professional as soon as possible:  allergic reactions like skin rash, itching or hives, swelling of the  face, lips, or tongue  chest pain or chest tightness  signs and symptoms of a dangerous change in heartbeat or heart rhythm like chest pain; dizziness; fast, irregular heartbeat; palpitations; feeling faint or lightheaded; falls; breathing problems  signs and symptoms of a stroke like changes in vision; confusion; trouble speaking or understanding; severe headaches; sudden numbness or weakness of the face, arm or leg; trouble walking; dizziness; loss of balance or coordination  signs and symptoms of serotonin syndrome like irritable; confusion; diarrhea; fast or irregular heartbeat; muscle twitching; stiff muscles; trouble walking; sweating; high fever; seizures; chills; vomiting Side effects that usually do not require medical attention (report to your doctor or health care professional if they continue or are bothersome):  diarrhea  dizziness  drowsiness  dry mouth  headache  nausea, vomiting  pain, tingling, numbness in the hands or feet  stomach pain This list may not describe all possible side effects. Call your doctor for medical advice about side effects. You may report side effects to FDA at 1-800-FDA-1088. Where should I keep my  medicine? Keep out of the reach of children. Store at room temperature between 15 and 30 degrees C (59 and 86 degrees F). Protect from light and moisture. Throw away any unused medicine after the expiration date. NOTE: This sheet is a summary. It may not cover all possible information. If you have questions about this medicine, talk to your doctor, pharmacist, or health care provider.  2020 Elsevier/Gold Standard (2017-08-07 14:58:08)    Migraine Headache A migraine headache is a very strong throbbing pain on one side or both sides of your head. This type of headache can also cause other symptoms. It can last from 4 hours to 3 days. Talk with your doctor about what things may bring on (trigger) this condition. What are the causes? The exact  cause of this condition is not known. This condition may be triggered or caused by:  Drinking alcohol.  Smoking.  Taking medicines, such as: ? Medicine used to treat chest pain (nitroglycerin). ? Birth control pills. ? Estrogen. ? Some blood pressure medicines.  Eating or drinking certain products.  Doing physical activity. Other things that may trigger a migraine headache include:  Having a menstrual period.  Pregnancy.  Hunger.  Stress.  Not getting enough sleep or getting too much sleep.  Weather changes.  Tiredness (fatigue). What increases the risk?  Being 12-68 years old.  Being female.  Having a family history of migraine headaches.  Being Caucasian.  Having depression or anxiety.  Being very overweight. What are the signs or symptoms?  A throbbing pain. This pain may: ? Happen in any area of the head, such as on one side or both sides. ? Make it hard to do daily activities. ? Get worse with physical activity. ? Get worse around bright lights or loud noises.  Other symptoms may include: ? Feeling sick to your stomach (nauseous). ? Vomiting. ? Dizziness. ? Being sensitive to bright lights, loud noises, or smells.  Before you get a migraine headache, you may get warning signs (an aura). An aura may include: ? Seeing flashing lights or having blind spots. ? Seeing bright spots, halos, or zigzag lines. ? Having tunnel vision or blurred vision. ? Having numbness or a tingling feeling. ? Having trouble talking. ? Having weak muscles.  Some people have symptoms after a migraine headache (postdromal phase), such as: ? Tiredness. ? Trouble thinking (concentrating). How is this treated?  Taking medicines that: ? Relieve pain. ? Relieve the feeling of being sick to your stomach. ? Prevent migraine headaches.  Treatment may also include: ? Having acupuncture. ? Avoiding foods that bring on migraine headaches. ? Learning ways to control your  body functions (biofeedback). ? Therapy to help you know and deal with negative thoughts (cognitive behavioral therapy). Follow these instructions at home: Medicines  Take over-the-counter and prescription medicines only as told by your doctor.  Ask your doctor if the medicine prescribed to you: ? Requires you to avoid driving or using heavy machinery. ? Can cause trouble pooping (constipation). You may need to take these steps to prevent or treat trouble pooping:  Drink enough fluid to keep your pee (urine) pale yellow.  Take over-the-counter or prescription medicines.  Eat foods that are high in fiber. These include beans, whole grains, and fresh fruits and vegetables.  Limit foods that are high in fat and sugar. These include fried or sweet foods. Lifestyle  Do not drink alcohol.  Do not use any products that contain nicotine or tobacco, such as  cigarettes, e-cigarettes, and chewing tobacco. If you need help quitting, ask your doctor.  Get at least 8 hours of sleep every night.  Limit and deal with stress. General instructions      Keep a journal to find out what may bring on your migraine headaches. For example, write down: ? What you eat and drink. ? How much sleep you get. ? Any change in what you eat or drink. ? Any change in your medicines.  If you have a migraine headache: ? Avoid things that make your symptoms worse, such as bright lights. ? It may help to lie down in a dark, quiet room. ? Do not drive or use heavy machinery. ? Ask your doctor what activities are safe for you.  Keep all follow-up visits as told by your doctor. This is important. Contact a doctor if:  You get a migraine headache that is different or worse than others you have had.  You have more than 15 headache days in one month. Get help right away if:  Your migraine headache gets very bad.  Your migraine headache lasts longer than 72 hours.  You have a fever.  You have a stiff  neck.  You have trouble seeing.  Your muscles feel weak or like you cannot control them.  You start to lose your balance a lot.  You start to have trouble walking.  You pass out (faint).  You have a seizure. Summary  A migraine headache is a very strong throbbing pain on one side or both sides of your head. These headaches can also cause other symptoms.  This condition may be treated with medicines and changes to your lifestyle.  Keep a journal to find out what may bring on your migraine headaches.  Contact a doctor if you get a migraine headache that is different or worse than others you have had.  Contact your doctor if you have more than 15 headache days in a month. This information is not intended to replace advice given to you by your health care provider. Make sure you discuss any questions you have with your health care provider. Document Revised: 05/17/2018 Document Reviewed: 03/07/2018 Elsevier Patient Education  [place].
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
## 2019-10-21 ENCOUNTER — Telehealth: Payer: Self-pay | Admitting: *Deleted

## 2019-10-21 MED ORDER — RIZATRIPTAN BENZOATE 5 MG PO TBDP: 5.0000 mg | ORAL_TABLET | ORAL | 0 refills | Status: DC | PRN

## 2019-10-21 NOTE — Telephone Encounter (Signed)
I called and spoke to pharmacy, Rise Paganini and she stated she will change to 5mg  odt tabs, I relayed to pt as well.  Appreciated call.

## 2019-10-21 NOTE — Telephone Encounter (Signed)
Received fax from pharmacy that pt was on rizatriptan 10mg  odt tablets, this was changed to rizatriptan 5mg  tabs so pt did not have to split the 10mg  in half.  She was not aware that the 5mg  did not come in odt form and would like to go back to 10mg  odt tabs (and will remain splitting in half) if does not come in 5mg  odt tablet.

## 2019-11-11 NOTE — Progress Notes (Signed)
I reviewed note and agree with plan.   Penni Bombard, MD 71/03/4578, 9:98 PM Certified in Neurology, Neurophysiology and Neuroimaging  [facility] [address] [place], [place] [address] [phone]

## 2019-11-26 ENCOUNTER — Telehealth: Payer: Self-pay | Admitting: Family Medicine

## 2019-11-26 MED ORDER — RIZATRIPTAN BENZOATE 5 MG PO TBDP: 5.0000 mg | ORAL_TABLET | ORAL | 3 refills | Status: DC | PRN

## 2019-11-26 NOTE — Telephone Encounter (Signed)
Pt has called to report that she has not had a refill on her rizatriptan (MAXALT-MLT) 5 MG disintegrating tablet since Aug.  Pt is asking this rizatriptan (MAXALT-MLT) 5 MG disintegrating tablet please be sent to [facility] [id number] [facility] [place]

## 2019-11-26 NOTE — Addendum Note (Signed)
Addended by: Brandon Melnick on: 11/26/2019 01:44 PM   Modules accepted: Orders

## 2019-12-14 DIAGNOSIS — Z20822 Contact with and (suspected) exposure to covid-19: Secondary | ICD-10-CM | POA: Diagnosis not present
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
## 2020-01-22 ENCOUNTER — Telehealth: Payer: BC Managed Care – PPO | Admitting: Family Medicine

## 2020-02-03 DIAGNOSIS — R0602 Shortness of breath: Secondary | ICD-10-CM | POA: Diagnosis not present

## 2020-02-03 DIAGNOSIS — R059 Cough, unspecified: Secondary | ICD-10-CM | POA: Diagnosis not present

## 2020-02-03 DIAGNOSIS — R062 Wheezing: Secondary | ICD-10-CM | POA: Diagnosis not present

## 2020-02-03 DIAGNOSIS — J029 Acute pharyngitis, unspecified: Secondary | ICD-10-CM | POA: Diagnosis not present

## 2020-04-06 ENCOUNTER — Other Ambulatory Visit: Payer: Self-pay | Admitting: *Deleted

## 2020-04-06 MED ORDER — RIZATRIPTAN BENZOATE 5 MG PO TBDP: 5.0000 mg | ORAL_TABLET | ORAL | 3 refills | Status: DC | PRN
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
## 2020-07-14 NOTE — Patient Instructions (Signed)
Below is our plan:  We will try Nurtec in place of rizatriptan. Take one daily as needed. You can take ondansetron for nausea.   Please make sure you are staying well hydrated. I recommend 50-60 ounces daily. Well balanced diet and regular exercise encouraged. Consistent sleep schedule with 6-8 hours recommended.   Please continue follow up with care team as directed.   Follow up with me in 6 months   You may receive a survey regarding today's visit. I encourage you to leave honest feed back as I do use this information to improve patient care. Thank you for seeing me today!    Rimegepant oral dissolving tablet What is this medicine? RIMEGEPANT (ri ME je pant) is used to treat migraine headaches with or without aura. An aura is a strange feeling or visual disturbance that warns you of an attack. It is also used to prevent migraine headaches. This medicine may be used for other purposes; ask your health care provider or pharmacist if you have questions. COMMON BRAND NAME(S): NURTEC ODT What should I tell my health care provider before I take this medicine? They need to know if you have any of these conditions: kidney disease liver disease an unusual or allergic reaction to rimegepant, other medicines, foods, dyes, or preservatives pregnant or trying to get pregnant breast-feeding How should I use this medicine? Take the medicine by mouth. Follow the directions on the prescription label. Leave the tablet in the sealed blister pack until you are ready to take it. With dry hands, open the blister and gently remove the tablet. If the tablet breaks or crumbles, throw it away and take a new tablet out of the blister pack. Place the tablet in the mouth and allow it to dissolve, and then swallow. Do not cut, crush, or chew this medicine. You do not need water to take this medicine. Talk to your pediatrician about the use of this medicine in children. Special care may be needed. Overdosage: If  you think you have taken too much of this medicine contact a poison control center or emergency room at once. NOTE: This medicine is only for you. Do not share this medicine with others. What if I miss a dose? This does not apply. This medicine is not for regular use. What may interact with this medicine? This medicine may interact with the following medications: certain medicines for fungal infections like fluconazole, itraconazole rifampin This list may not describe all possible interactions. Give your health care provider a list of all the medicines, herbs, non-prescription drugs, or dietary supplements you use. Also tell them if you smoke, drink alcohol, or use illegal drugs. Some items may interact with your medicine. What should I watch for while using this medicine? Visit your health care professional for regular checks on your progress. Tell your health care professional if your symptoms do not start to get better or if they get worse. What side effects may I notice from receiving this medicine? Side effects that you should report to your doctor or health care professional as soon as possible: allergic reactions like skin rash, itching or hives; swelling of the face, lips, or tongue Side effects that usually do not require medical attention (report these to your doctor or health care professional if they continue or are bothersome): nausea This list may not describe all possible side effects. Call your doctor for medical advice about side effects. You may report side effects to FDA at 1-800-FDA-1088. Where should I keep my  medicine? Keep out of the reach of children and pets. Store at room temperature between 20 and 25 degrees C (68 and 77 degrees F). Get rid of any unused medicine after the expiration date. To get rid of medicines that are no longer needed or have expired: Take the medicine to a medicine take-back program. Check with your pharmacy or law enforcement to find a  location. If you cannot return the medicine, check the label or package insert to see if the medicine should be thrown out in the garbage or flushed down the toilet. If you are not sure, ask your health care provider. If it is safe to put it in the trash, take the medicine out of the container. Mix the medicine with cat litter, dirt, coffee grounds, or other unwanted substance. Seal the mixture in a bag or container. Put it in the trash. NOTE: This sheet is a summary. It may not cover all possible information. If you have questions about this medicine, talk to your doctor, pharmacist, or health care provider.  2021 Elsevier/Gold Standard (2019-07-08 17:56:55)   Migraine Headache A migraine headache is a very strong throbbing pain on one side or both sides of your head. This type of headache can also cause other symptoms. It can last from 4 hours to 3 days. Talk with your doctor about what things may bring on (trigger) this condition. What are the causes? The exact cause of this condition is not known. This condition may be triggered or caused by: Drinking alcohol. Smoking. Taking medicines, such as: Medicine used to treat chest pain (nitroglycerin). Birth control pills. Estrogen. Some blood pressure medicines. Eating or drinking certain products. Doing physical activity. Other things that may trigger a migraine headache include: Having a menstrual period. Pregnancy. Hunger. Stress. Not getting enough sleep or getting too much sleep. Weather changes. Tiredness (fatigue). What increases the risk? Being 20-52 years old. Being female. Having a family history of migraine headaches. Being Caucasian. Having depression or anxiety. Being very overweight. What are the signs or symptoms? A throbbing pain. This pain may: Happen in any area of the head, such as on one side or both sides. Make it hard to do daily activities. Get worse with physical activity. Get worse around bright lights  or loud noises. Other symptoms may include: Feeling sick to your stomach (nauseous). Vomiting. Dizziness. Being sensitive to bright lights, loud noises, or smells. Before you get a migraine headache, you may get warning signs (an aura). An aura may include: Seeing flashing lights or having blind spots. Seeing bright spots, halos, or zigzag lines. Having tunnel vision or blurred vision. Having numbness or a tingling feeling. Having trouble talking. Having weak muscles. Some people have symptoms after a migraine headache (postdromal phase), such as: Tiredness. Trouble thinking (concentrating). How is this treated? Taking medicines that: Relieve pain. Relieve the feeling of being sick to your stomach. Prevent migraine headaches. Treatment may also include: Having acupuncture. Avoiding foods that bring on migraine headaches. Learning ways to control your body functions (biofeedback). Therapy to help you know and deal with negative thoughts (cognitive behavioral therapy). Follow these instructions at home: Medicines Take over-the-counter and prescription medicines only as told by your doctor. Ask your doctor if the medicine prescribed to you: Requires you to avoid driving or using heavy machinery. Can cause trouble pooping (constipation). You may need to take these steps to prevent or treat trouble pooping: Drink enough fluid to keep your pee (urine) pale yellow. Take over-the-counter or prescription  medicines. Eat foods that are high in fiber. These include beans, whole grains, and fresh fruits and vegetables. Limit foods that are high in fat and sugar. These include fried or sweet foods. Lifestyle Do not drink alcohol. Do not use any products that contain nicotine or tobacco, such as cigarettes, e-cigarettes, and chewing tobacco. If you need help quitting, ask your doctor. Get at least 8 hours of sleep every night. Limit and deal with stress. General instructions Keep a journal  to find out what may bring on your migraine headaches. For example, write down: What you eat and drink. How much sleep you get. Any change in what you eat or drink. Any change in your medicines. If you have a migraine headache: Avoid things that make your symptoms worse, such as bright lights. It may help to lie down in a dark, quiet room. Do not drive or use heavy machinery. Ask your doctor what activities are safe for you. Keep all follow-up visits as told by your doctor. This is important.      Contact a doctor if: You get a migraine headache that is different or worse than others you have had. You have more than 15 headache days in one month. Get help right away if: Your migraine headache gets very bad. Your migraine headache lasts longer than 72 hours. You have a fever. You have a stiff neck. You have trouble seeing. Your muscles feel weak or like you cannot control them. You start to lose your balance a lot. You start to have trouble walking. You pass out (faint). You have a seizure. Summary A migraine headache is a very strong throbbing pain on one side or both sides of your head. These headaches can also cause other symptoms. This condition may be treated with medicines and changes to your lifestyle. Keep a journal to find out what may bring on your migraine headaches. Contact a doctor if you get a migraine headache that is different or worse than others you have had. Contact your doctor if you have more than 15 headache days in a month. This information is not intended to replace advice given to you by your health care provider. Make sure you discuss any questions you have with your health care provider. Document Revised: 05/17/2018 Document Reviewed: 03/07/2018 Elsevier Patient Education  [place].

## 2020-07-15 ENCOUNTER — Ambulatory Visit (INDEPENDENT_AMBULATORY_CARE_PROVIDER_SITE_OTHER): Payer: BC Managed Care – PPO | Admitting: Family Medicine

## 2020-07-15 ENCOUNTER — Encounter: Payer: Self-pay | Admitting: Family Medicine

## 2020-07-15 VITALS — BP 101/64 | HR 74 | Ht 65.0 in | Wt 121.0 lb

## 2020-07-15 DIAGNOSIS — G43009 Migraine without aura, not intractable, without status migrainosus: Secondary | ICD-10-CM

## 2020-07-15 MED ORDER — ONDANSETRON HCL 4 MG PO TABS: 4.0000 mg | ORAL_TABLET | Freq: Three times a day (TID) | ORAL | 0 refills | Status: DC | PRN

## 2020-07-15 MED ORDER — NURTEC 75 MG PO TBDP: 75.0000 mg | ORAL_TABLET | Freq: Every day | ORAL | 11 refills | Status: DC | PRN

## 2020-07-16 DIAGNOSIS — H10212 Acute toxic conjunctivitis, left eye: Secondary | ICD-10-CM | POA: Diagnosis not present

## 2020-07-20 DIAGNOSIS — N898 Other specified noninflammatory disorders of vagina: Secondary | ICD-10-CM | POA: Diagnosis not present

## 2020-07-20 DIAGNOSIS — R300 Dysuria: Secondary | ICD-10-CM | POA: Diagnosis not present

## 2020-07-20 DIAGNOSIS — N760 Acute vaginitis: Secondary | ICD-10-CM | POA: Diagnosis not present

## 2020-08-02 ENCOUNTER — Telehealth: Payer: Self-pay | Admitting: *Deleted

## 2020-08-02 DIAGNOSIS — D1801 Hemangioma of skin and subcutaneous tissue: Secondary | ICD-10-CM | POA: Diagnosis not present

## 2020-08-02 DIAGNOSIS — L565 Disseminated superficial actinic porokeratosis (DSAP): Secondary | ICD-10-CM | POA: Diagnosis not present

## 2020-08-02 DIAGNOSIS — D225 Melanocytic nevi of trunk: Secondary | ICD-10-CM | POA: Diagnosis not present

## 2020-08-02 DIAGNOSIS — L821 Other seborrheic keratosis: Secondary | ICD-10-CM | POA: Diagnosis not present

## 2020-08-02 NOTE — Telephone Encounter (Signed)
Nurtec PA form from  [place], tried/failed: topiramate, rizatriptan, sumatriptan, fioricet, NSAIDS, Botox, Cambia, propranolol, Tylenol, Reglan. On NP desk for review, signature.

## 2020-08-02 NOTE — Telephone Encounter (Signed)
Nurtec Pa clinical questions answered, signed, faxed to [facility].

## 2020-08-03 NOTE — Telephone Encounter (Addendum)
[employer], spoke with Gerald Stabs and answered clinical question needed. PA is Case # [id number]. He stated turn around 72 hours but could be less.

## 2020-08-03 NOTE — Telephone Encounter (Signed)
Sarah from [place] called said they received the pro authorization however missing one of the questions which was has the pt tried/failed or has intolerance to Sandi Raveling went ahead and faxed over the form. Left the fax number which is [phone] also phone number if you have any questions [phone] option 3, option 2, option 4, then option 2.

## 2020-08-11 ENCOUNTER — Encounter: Payer: Self-pay | Admitting: *Deleted

## 2020-08-11 DIAGNOSIS — Z20822 Contact with and (suspected) exposure to covid-19: Secondary | ICD-10-CM | POA: Diagnosis not present

## 2020-08-11 NOTE — Telephone Encounter (Signed)
Called [employer] to check status of nurtec PA, spoke with Wilhemena Durie T who stated it was denied, PA Ref #[id number]. Pharmacy [phone], opt 3. She transferred call, spoke with [facility] who stated Nurtec is not on formulary, must try/fail the alternative,  ubrelvy. Will advise patient to try savings card.

## 2020-08-27 DIAGNOSIS — R0981 Nasal congestion: Secondary | ICD-10-CM | POA: Diagnosis not present

## 2020-08-27 DIAGNOSIS — R052 Subacute cough: Secondary | ICD-10-CM | POA: Diagnosis not present

## 2020-08-27 DIAGNOSIS — R0989 Other specified symptoms and signs involving the circulatory and respiratory systems: Secondary | ICD-10-CM | POA: Diagnosis not present

## 2020-09-17 ENCOUNTER — Encounter: Payer: Self-pay | Admitting: Family Medicine

## 2020-09-19 DIAGNOSIS — Z20822 Contact with and (suspected) exposure to covid-19: Secondary | ICD-10-CM | POA: Diagnosis not present

## 2020-09-20 ENCOUNTER — Other Ambulatory Visit: Payer: Self-pay | Admitting: *Deleted

## 2020-09-20 DIAGNOSIS — G43009 Migraine without aura, not intractable, without status migrainosus: Secondary | ICD-10-CM

## 2020-09-20 MED ORDER — UBRELVY 100 MG PO TABS: ORAL_TABLET | ORAL | 5 refills | Status: DC

## 2020-09-20 MED ORDER — ONDANSETRON HCL 4 MG PO TABS: 4.0000 mg | ORAL_TABLET | Freq: Three times a day (TID) | ORAL | 0 refills | Status: DC | PRN

## 2020-09-22 ENCOUNTER — Telehealth: Payer: Self-pay | Admitting: *Deleted

## 2020-09-22 NOTE — Telephone Encounter (Signed)
Submitted PA Ubrelvy on [facility]. Key: [id number]. Received instant approval: "Effective from 09/22/2020 through 12/14/2020."

## 2020-12-06 ENCOUNTER — Telehealth: Payer: Self-pay | Admitting: *Deleted

## 2020-12-06 NOTE — Telephone Encounter (Signed)
Submitted PA Ubrelvy on [facility]. [id number]. Waiting on determination from [place].

## 2020-12-07 NOTE — Telephone Encounter (Signed)
PA approved effective from 12/06/2020 through 12/05/2021.

## 2021-01-17 ENCOUNTER — Encounter: Payer: Self-pay | Admitting: Family Medicine

## 2021-01-17 ENCOUNTER — Ambulatory Visit (INDEPENDENT_AMBULATORY_CARE_PROVIDER_SITE_OTHER): Payer: BC Managed Care – PPO | Admitting: Family Medicine

## 2021-01-17 ENCOUNTER — Other Ambulatory Visit: Payer: Self-pay

## 2021-01-17 VITALS — BP 100/70 | HR 78 | Ht 65.0 in | Wt 123.5 lb

## 2021-01-17 DIAGNOSIS — G43009 Migraine without aura, not intractable, without status migrainosus: Secondary | ICD-10-CM
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
## 2021-01-17 NOTE — Patient Instructions (Signed)
Below is our plan:  We will continue Ubrelvy as needed. Please take 1 tablet at onset of headache. May take 1 additional tablet in 2 hours if needed. Do not take more than 2 tablets in 24 hours or more than 10 in a month.  You can pair with Tylenol 1000mg  and ibuprofen 800mg  if needed for aggressive headaches. Benadryl 25-50mg  works well for weather related headaches.   Please make sure you are staying well hydrated. I recommend 50-60 ounces daily. Well balanced diet and regular exercise encouraged. Consistent sleep schedule with 6-8 hours recommended.   Please continue follow up with care team as directed.   Follow up with me in 1 year, sooner if needed.   You may receive a survey regarding today's visit. I encourage you to leave honest feed back as I do use this information to improve patient care. Thank you for seeing me today!

## 2021-02-08 DIAGNOSIS — J069 Acute upper respiratory infection, unspecified: Secondary | ICD-10-CM | POA: Diagnosis not present

## 2021-02-25 DIAGNOSIS — H109 Unspecified conjunctivitis: Secondary | ICD-10-CM | POA: Diagnosis not present

## 2021-08-15 ENCOUNTER — Encounter: Payer: Self-pay | Admitting: Family Medicine

## 2021-08-18 NOTE — Telephone Encounter (Signed)
I called Kayla Hayden to see why copay card for Ubrelvy not working. They reviewed. Were able to apply copay card and bring cost down to 0.00.

## 2021-09-14 DIAGNOSIS — H9209 Otalgia, unspecified ear: Secondary | ICD-10-CM | POA: Diagnosis not present

## 2021-09-28 ENCOUNTER — Encounter: Payer: Self-pay | Admitting: Family Medicine

## 2021-11-29 ENCOUNTER — Telehealth: Payer: Self-pay | Admitting: *Deleted

## 2021-11-29 NOTE — Telephone Encounter (Signed)
PA approved effective from 11/29/2021 through 11/28/2022.

## 2021-11-29 NOTE — Telephone Encounter (Signed)
Submitted PA Ubrelvy on [facility]. Key: [id number]. Waiting on determination from [place].

## 2022-01-02 DIAGNOSIS — L02214 Cutaneous abscess of groin: Secondary | ICD-10-CM | POA: Diagnosis not present
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
## 2022-01-18 NOTE — Patient Instructions (Signed)
Below is our plan:  We will continue Ubrelvy as needed. Let me know if you have any trouble getting your medication.   Please make sure you are staying well hydrated. I recommend 50-60 ounces daily. Well balanced diet and regular exercise encouraged. Consistent sleep schedule with 6-8 hours recommended.   Please continue follow up with care team as directed.   Follow up with me in 1 year  You may receive a survey regarding today's visit. I encourage you to leave honest feed back as I do use this information to improve patient care. Thank you for seeing me today!

## 2022-01-23 ENCOUNTER — Encounter: Payer: Self-pay | Admitting: Family Medicine

## 2022-01-23 ENCOUNTER — Ambulatory Visit (INDEPENDENT_AMBULATORY_CARE_PROVIDER_SITE_OTHER): Payer: BC Managed Care – PPO | Admitting: Family Medicine

## 2022-01-23 VITALS — BP 114/75 | HR 90 | Ht 64.0 in | Wt 122.0 lb

## 2022-01-23 DIAGNOSIS — G43009 Migraine without aura, not intractable, without status migrainosus: Secondary | ICD-10-CM

## 2022-01-23 MED ORDER — UBRELVY 100 MG PO TABS: ORAL_TABLET | ORAL | 5 refills | Status: DC

## 2022-08-30 DIAGNOSIS — Z01419 Encounter for gynecological examination (general) (routine) without abnormal findings: Secondary | ICD-10-CM | POA: Diagnosis not present

## 2022-08-30 DIAGNOSIS — Z130 Encounter for screening for diseases of the blood and blood-forming organs and certain disorders involving the immune mechanism: Secondary | ICD-10-CM | POA: Diagnosis not present

## 2022-08-30 DIAGNOSIS — Z124 Encounter for screening for malignant neoplasm of cervix: Secondary | ICD-10-CM | POA: Diagnosis not present

## 2022-09-25 DIAGNOSIS — J0100 Acute maxillary sinusitis, unspecified: Secondary | ICD-10-CM | POA: Diagnosis not present

## 2022-11-27 ENCOUNTER — Telehealth: Payer: Self-pay | Admitting: Pharmacy Technician

## 2022-11-27 ENCOUNTER — Other Ambulatory Visit (HOSPITAL_COMMUNITY): Payer: Self-pay

## 2022-11-27 NOTE — Telephone Encounter (Signed)
Pharmacy Patient Advocate Encounter   Received notification from CoverMyMeds that prior authorization for Ubrelvy 100MG  tablets is required/requested.   Insurance verification completed.   The patient is insured through [facility] .   Per test claim: PA required; PA started via CoverMyMeds. KEY [id number] . Waiting for clinical questions to populate.

## 2022-11-30 NOTE — Telephone Encounter (Signed)
Clinical questions have been submitted-awaiting determination. 

## 2022-12-07 ENCOUNTER — Encounter: Payer: Self-pay | Admitting: Family Medicine

## 2022-12-11 NOTE — Telephone Encounter (Signed)
Received a faxed form requesting additional information-faxed completed form to [phone].

## 2022-12-13 NOTE — Telephone Encounter (Signed)
Pharmacy Patient Advocate Encounter  Received notification from [facility] that Prior Authorization for Ubrelvy 100MG  tablets has been APPROVED from 11/30/2022 to 11/30/2023   PA #/Case ID/Reference #: PA Case ID #: [id number]

## 2023-01-08 ENCOUNTER — Ambulatory Visit: Payer: BC Managed Care – PPO | Admitting: Family Medicine

## 2023-01-08 ENCOUNTER — Encounter: Payer: Self-pay | Admitting: Family Medicine

## 2023-01-08 VITALS — BP 114/79 | HR 81 | Ht 64.0 in | Wt 123.0 lb

## 2023-01-08 DIAGNOSIS — G43009 Migraine without aura, not intractable, without status migrainosus: Secondary | ICD-10-CM

## 2023-01-08 MED ORDER — ONDANSETRON HCL 4 MG PO TABS: 4.0000 mg | ORAL_TABLET | Freq: Three times a day (TID) | ORAL | 0 refills | Status: DC | PRN

## 2023-01-08 MED ORDER — UBRELVY 100 MG PO TABS: ORAL_TABLET | ORAL | 5 refills | Status: DC
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
## 2023-01-08 NOTE — Patient Instructions (Signed)
Below is our plan:  We will continue Ubrelvy and ondansetron as needed.   Please make sure you are staying well hydrated. I recommend 50-60 ounces daily. Well balanced diet and regular exercise encouraged. Consistent sleep schedule with 6-8 hours recommended.   Please continue follow up with care team as directed.   Follow up with me in 1 year   You may receive a survey regarding today's visit. I encourage you to leave honest feed back as I do use this information to improve patient care. Thank you for seeing me today!   'GENERAL HEADACHE INFORMATION:   Natural supplements: Magnesium Oxide or Magnesium Glycinate 500 mg at bed (up to 800 mg daily) Coenzyme Q10 300 mg in AM Vitamin B2- 200 mg twice a day   Add 1 supplement at a time since even natural supplements can have undesirable side effects. You can sometimes buy supplements cheaper (especially Coenzyme Q10) at www.WebmailGuide.co.za or at [facility].  Migraine with aura: There is increased risk for stroke in women with migraine with aura and a contraindication for the combined contraceptive pill for use by women who have migraine with aura. The risk for women with migraine without aura is lower. However other risk factors like smoking are far more likely to increase stroke risk than migraine. There is a recommendation for no smoking and for the use of OCPs without estrogen such as progestogen only pills particularly for women with migraine with aura.Marland Kitchen People who have migraine headaches with auras may be 3 times more likely to have a stroke caused by a blood clot, compared to migraine patients who don't see auras. Women who take hormone-replacement therapy may be 30 percent more likely to suffer a clot-based stroke than women not taking medication containing estrogen. Other risk factors like smoking and high blood pressure may be  much more important.    Vitamins and herbs that show potential:   Magnesium: Magnesium (250 mg twice a day or 500 mg at  bed) has a relaxant effect on smooth muscles such as blood vessels. Individuals suffering from frequent or daily headache usually have low magnesium levels which can be increase with daily supplementation of 400-750 mg. Three trials found 40-90% average headache reduction  when used as a preventative. Magnesium may help with headaches are aura, the best evidence for magnesium is for migraine with aura is its thought to stop the cortical spreading depression we believe is the pathophysiology of migraine aura.Magnesium also demonstrated the benefit in menstrually related migraine.  Magnesium is part of the messenger system in the serotonin cascade and it is a good muscle relaxant.  It is also useful for constipation which can be a side effect of other medications used to treat migraine. Good sources include nuts, whole grains, and tomatoes. Side Effects: loose stool/diarrhea  Riboflavin (vitamin B 2) 200 mg twice a day. This vitamin assists nerve cells in the production of ATP a principal energy storing molecule.  It is necessary for many chemical reactions in the body.  There have been at least 3 clinical trials of riboflavin using 400 mg per day all of which suggested that migraine frequency can be decreased.  All 3 trials showed significant improvement in over half of migraine sufferers.  The supplement is found in bread, cereal, milk, meat, and poultry.  Most Americans get more riboflavin than the recommended daily allowance, however riboflavin deficiency is not necessary for the supplements to help prevent headache. Side effects: energizing, green urine   Coenzyme Q10:  This is present in almost all cells in the body and is critical component for the conversion of energy.  Recent studies have shown that a nutritional supplement of CoQ10 can reduce the frequency of migraine attacks by improving the energy production of cells as with riboflavin.  Doses of 150 mg twice a day have been shown to be effective.    Melatonin: Increasing evidence shows correlation between melatonin secretion and headache conditions.  Melatonin supplementation has decreased headache intensity and duration.  It is widely used as a sleep aid.  Sleep is natures way of dealing with migraine.  A dose of 3 mg is recommended to start for headaches including cluster headache. Higher doses up to 15 mg has been reviewed for use in Cluster headache and have been used. The rationale behind using melatonin for cluster is that many theories regarding the cause of Cluster headache center around the disruption of the normal circadian rhythm in the brain.  This helps restore the normal circadian rhythm.   HEADACHE DIET: Foods and beverages which may trigger migraine Note that only 20% of headache patients are food sensitive. You will know if you are food sensitive if you get a headache consistently 20 minutes to 2 hours after eating a certain food. Only cut out a food if it causes headaches, otherwise you might remove foods you enjoy! What matters most for diet is to eat a well balanced healthy diet full of vegetables and low fat protein, and to not miss meals.   Chocolate, other sweets ALL cheeses except cottage and cream cheese Dairy products, yogurt, sour cream, ice cream Liver Meat extracts (Bovril, Marmite, meat tenderizers) Meats or fish which have undergone aging, fermenting, pickling or smoking. These include: Hotdogs,salami,Lox,sausage, mortadellas,smoked salmon, pepperoni, Pickled herring Pods of broad bean (English beans, Chinese pea pods, [place] (fava) beans, lima and navy beans Ripe avocado, ripe banana Yeast extracts or active yeast preparations such as Brewer's or Fleishman's (commercial bakes goods are permitted) Tomato based foods, pizza (lasagna, etc.)   MSG (monosodium glutamate) is disguised as many things; look for these common aliases: Monopotassium glutamate Autolysed yeast Hydrolysed protein Sodium  caseinate "flavorings" "all natural preservatives" Nutrasweet   Avoid all other foods that convincingly provoke headaches.   Resources: The Dizzy [place] Laundry Your Headache Diet, migrainestrong.com  https://zamora-andrews.com/   Caffeine and Migraine For patients that have migraine, caffeine intake more than 3 days per week can lead to dependency and increased migraine frequency. I would recommend cutting back on your caffeine intake as best you can. The recommended amount of caffeine is 200-300 mg daily, although migraine patients may experience dependency at even lower doses. While you may notice an increase in headache temporarily, cutting back will be helpful for headaches in the long run. For more information on caffeine and migraine, visit: https://americanmigrainefoundation.org/resource-library/caffeine-and-migraine/   Headache Prevention Strategies:   1. Maintain a headache diary; learn to identify and avoid triggers.  - This can be a simple note where you log when you had a headache, associated symptoms, and medications used - There are several smartphone apps developed to help track migraines: Migraine Buddy, Migraine Monitor, Curelator N1-Headache App   Common triggers include: Emotional triggers: Emotional/Upset family or friends Emotional/Upset occupation Business reversal/success Anticipation anxiety Crisis-serious Post-crisis periodNew job/position   Physical triggers: Vacation Day Weekend Strenuous Exercise High Altitude Location New Move Menstrual Day Physical Illness Oversleep/Not enough sleep Weather changes Light: Photophobia or light sesnitivity treatment involves a balance between desensitization and reduction in overly  strong input. Use dark polarized glasses outside, but not inside. Avoid bright or fluorescent light, but do not dim environment to the point that going into a normally lit room hurts. Consider  FL-41 tint lenses, which reduce the most irritating wavelengths without blocking too much light.  These can be obtained at axonoptics.com or theraspecs.com Foods: see list above.   2. Limit use of acute treatments (over-the-counter medications, triptans, etc.) to no more than 2 days per week or 10 days per month to prevent medication overuse headache (rebound headache).     3. Follow a regular schedule (including weekends and holidays): Don't skip meals. Eat a balanced diet. 8 hours of sleep nightly. Minimize stress. Exercise 30 minutes per day. Being overweight is associated with a 5 times increased risk of chronic migraine. Keep well hydrated and drink 6-8 glasses of water per day.   4. Initiate non-pharmacologic measures at the earliest onset of your headache. Rest and quiet environment. Relax and reduce stress. Breathe2Relax is a free app that can instruct you on    some simple relaxtion and breathing techniques. Http://Dawnbuse.com is a    free website that provides teaching videos on relaxation.  Also, there are  many apps that   can be downloaded for "mindful" relaxation.  An app called YOGA NIDRA will help walk you through mindfulness. Another app called Calm can be downloaded to give you a structured mindfulness guide with daily reminders and skill development. Headspace for guided meditation Mindfulness Based Stress Reduction Online Course: www.palousemindfulness.com Cold compresses.   5. Don't wait!! Take the maximum allowable dosage of prescribed medication at the first sign of migraine.   6. Compliance:  Take prescribed medication regularly as directed and at the first sign of a migraine.   7. Communicate:  Call your physician when problems arise, especially if your headaches change, increase in frequency/severity, or become associated with neurological symptoms (weakness, numbness, slurred speech, etc.). Proceed to emergency room if you experience new or worsening symptoms or  symptoms do not resolve, if you have new neurologic symptoms or if headache is severe, or for any concerning symptom.   8. Headache/pain management therapies: Consider various complementary methods, including medication, behavioral therapy, psychological counselling, biofeedback, massage therapy, acupuncture, dry needling, and other modalities.  Such measures may reduce the need for medications. Counseling for pain management, where patients learn to function and ignore/minimize their pain, seems to work very well.   9. Recommend changing family's attention and focus away from patient's headaches. Instead, emphasize daily activities. If first question of day is 'How are your headaches/Do you have a headache today?', then patient will constantly think about headaches, thus making them worse. Goal is to re-direct attention away from headaches, toward daily activities and other distractions.   10. Helpful Websites: www.AmericanHeadacheSociety.org PatentHood.ch www.headaches.org TightMarket.nl www.achenet.org

## 2023-02-25 DIAGNOSIS — R202 Paresthesia of skin: Secondary | ICD-10-CM | POA: Diagnosis not present

## 2023-03-08 DIAGNOSIS — R0789 Other chest pain: Secondary | ICD-10-CM | POA: Diagnosis not present

## 2023-03-08 DIAGNOSIS — M940 Chondrocostal junction syndrome [Tietze]: Secondary | ICD-10-CM | POA: Diagnosis not present

## 2023-03-17 DIAGNOSIS — J069 Acute upper respiratory infection, unspecified: Secondary | ICD-10-CM | POA: Diagnosis not present

## 2023-03-19 DIAGNOSIS — J111 Influenza due to unidentified influenza virus with other respiratory manifestations: Secondary | ICD-10-CM | POA: Diagnosis not present

## 2023-04-02 DIAGNOSIS — R0789 Other chest pain: Secondary | ICD-10-CM | POA: Diagnosis not present

## 2023-04-02 DIAGNOSIS — J0100 Acute maxillary sinusitis, unspecified: Secondary | ICD-10-CM | POA: Diagnosis not present

## 2023-08-22 DIAGNOSIS — L814 Other melanin hyperpigmentation: Secondary | ICD-10-CM | POA: Diagnosis not present

## 2023-08-22 DIAGNOSIS — B078 Other viral warts: Secondary | ICD-10-CM | POA: Diagnosis not present

## 2023-08-22 DIAGNOSIS — L821 Other seborrheic keratosis: Secondary | ICD-10-CM | POA: Diagnosis not present

## 2023-08-22 DIAGNOSIS — L565 Disseminated superficial actinic porokeratosis (DSAP): Secondary | ICD-10-CM | POA: Diagnosis not present

## 2023-09-17 ENCOUNTER — Encounter: Payer: Self-pay | Admitting: Family Medicine

## 2023-09-18 DIAGNOSIS — N390 Urinary tract infection, site not specified: Secondary | ICD-10-CM | POA: Diagnosis not present

## 2023-09-20 DIAGNOSIS — R399 Unspecified symptoms and signs involving the genitourinary system: Secondary | ICD-10-CM | POA: Diagnosis not present

## 2023-09-20 DIAGNOSIS — N898 Other specified noninflammatory disorders of vagina: Secondary | ICD-10-CM | POA: Diagnosis not present

## 2023-09-25 DIAGNOSIS — Z20822 Contact with and (suspected) exposure to covid-19: Secondary | ICD-10-CM | POA: Diagnosis not present

## 2023-09-25 DIAGNOSIS — R202 Paresthesia of skin: Secondary | ICD-10-CM | POA: Diagnosis not present

## 2023-09-25 DIAGNOSIS — R42 Dizziness and giddiness: Secondary | ICD-10-CM | POA: Diagnosis not present

## 2023-09-25 DIAGNOSIS — Z6820 Body mass index (BMI) 20.0-20.9, adult: Secondary | ICD-10-CM | POA: Diagnosis not present

## 2023-09-27 DIAGNOSIS — R3915 Urgency of urination: Secondary | ICD-10-CM | POA: Diagnosis not present

## 2023-10-01 ENCOUNTER — Encounter: Payer: Self-pay | Admitting: Family Medicine

## 2023-10-01 ENCOUNTER — Telehealth: Payer: Self-pay | Admitting: Family Medicine

## 2023-10-01 DIAGNOSIS — R202 Paresthesia of skin: Secondary | ICD-10-CM | POA: Diagnosis not present

## 2023-10-01 NOTE — Telephone Encounter (Signed)
 Pt called to request a call back or respond to Pt MyChart messgae . Pt states she might be having a bad reaction to medication . Pt was prescribe Antibiotic Friday But Pt stated she is also talking (Ubrogepant  (UBRELVY ) 100 MG TABS )  . Pt states she keep getting dizzy spells and her eyes feels really heavy the patient also stated that  she feel a tingly feeling coming from her head to her toes ,PT is very concern

## 2023-10-01 NOTE — Telephone Encounter (Signed)
 See phone call from 10/01/23

## 2023-10-02 ENCOUNTER — Encounter: Payer: Self-pay | Admitting: Family Medicine

## 2023-10-02 ENCOUNTER — Ambulatory Visit (INDEPENDENT_AMBULATORY_CARE_PROVIDER_SITE_OTHER): Admitting: Family Medicine

## 2023-10-02 VITALS — BP 115/76 | HR 100 | Ht 64.0 in | Wt 119.0 lb

## 2023-10-02 DIAGNOSIS — F419 Anxiety disorder, unspecified: Secondary | ICD-10-CM

## 2023-10-02 DIAGNOSIS — G43E09 Chronic migraine with aura, not intractable, without status migrainosus: Secondary | ICD-10-CM | POA: Diagnosis not present

## 2023-10-02 DIAGNOSIS — R300 Dysuria: Secondary | ICD-10-CM | POA: Diagnosis not present

## 2023-10-02 DIAGNOSIS — R200 Anesthesia of skin: Secondary | ICD-10-CM | POA: Diagnosis not present

## 2023-10-02 DIAGNOSIS — G43009 Migraine without aura, not intractable, without status migrainosus: Secondary | ICD-10-CM | POA: Diagnosis not present

## 2023-10-02 DIAGNOSIS — R202 Paresthesia of skin: Secondary | ICD-10-CM

## 2023-10-02 DIAGNOSIS — R29898 Other symptoms and signs involving the musculoskeletal system: Secondary | ICD-10-CM

## 2023-10-02 DIAGNOSIS — N898 Other specified noninflammatory disorders of vagina: Secondary | ICD-10-CM | POA: Diagnosis not present

## 2023-10-02 NOTE — Telephone Encounter (Signed)
 Patient said, went to urgent care yesterday was advised to get a CT Scan or if want ride out symptom of the silent migraine. Would like a call back to discuss what to do.

## 2023-10-02 NOTE — Telephone Encounter (Signed)
 Called pt at  [phone]. She accepted work in appt today at 930a with Amy. Scheduled pt.
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
## 2023-10-02 NOTE — Patient Instructions (Signed)
 Below is our plan:  We will avoid Ubrelvy  while on antibiotics but you may resume once UTI cleared and off abx. Use OTC analgesics while on abx. You can also use supplements as discussed and detailed below. We will check an MRI to make sure nothing else is going on. Let me know if you do not hear back to schedule.   Please make sure you are staying well hydrated. I recommend 50-60 ounces daily. Well balanced diet and regular exercise encouraged. Consistent sleep schedule with 6-8 hours recommended.   Please continue follow up with care team as directed.   Follow up with me in 01/2024  You may receive a survey regarding today's visit. I encourage you to leave honest feed back as I do use this information to improve patient care. Thank you for seeing me today!   GENERAL HEADACHE INFORMATION:   Natural supplements: Magnesium Oxide or Magnesium Glycinate 500 mg at bed (up to 800 mg daily) Coenzyme Q10 300 mg in AM Vitamin B2- 200 mg twice a day   Add 1 supplement at a time since even natural supplements can have undesirable side effects. You can sometimes buy supplements cheaper (especially Coenzyme Q10) at www.WebmailGuide.co.za or at [facility].  Migraine with aura: There is increased risk for stroke in women with migraine with aura and a contraindication for the combined contraceptive pill for use by women who have migraine with aura. The risk for women with migraine without aura is lower. However other risk factors like smoking are far more likely to increase stroke risk than migraine. There is a recommendation for no smoking and for the use of OCPs without estrogen such as progestogen only pills particularly for women with migraine with aura.SABRA People who have migraine headaches with auras may be 3 times more likely to have a stroke caused by a blood clot, compared to migraine patients who don't see auras. Women who take hormone-replacement therapy may be 30 percent more likely to suffer a clot-based  stroke than women not taking medication containing estrogen. Other risk factors like smoking and high blood pressure may be  much more important.    Vitamins and herbs that show potential:   Magnesium: Magnesium (250 mg twice a day or 500 mg at bed) has a relaxant effect on smooth muscles such as blood vessels. Individuals suffering from frequent or daily headache usually have low magnesium levels which can be increase with daily supplementation of 400-750 mg. Three trials found 40-90% average headache reduction  when used as a preventative. Magnesium may help with headaches are aura, the best evidence for magnesium is for migraine with aura is its thought to stop the cortical spreading depression we believe is the pathophysiology of migraine aura.Magnesium also demonstrated the benefit in menstrually related migraine.  Magnesium is part of the messenger system in the serotonin cascade and it is a good muscle relaxant.  It is also useful for constipation which can be a side effect of other medications used to treat migraine. Good sources include nuts, whole grains, and tomatoes. Side Effects: loose stool/diarrhea  Riboflavin (vitamin B 2) 200 mg twice a day. This vitamin assists nerve cells in the production of ATP a principal energy storing molecule.  It is necessary for many chemical reactions in the body.  There have been at least 3 clinical trials of riboflavin using 400 mg per day all of which suggested that migraine frequency can be decreased.  All 3 trials showed significant improvement in over half of  migraine sufferers.  The supplement is found in bread, cereal, milk, meat, and poultry.  Most Americans get more riboflavin than the recommended daily allowance, however riboflavin deficiency is not necessary for the supplements to help prevent headache. Side effects: energizing, green urine   Coenzyme Q10: This is present in almost all cells in the body and is critical component for the conversion of  energy.  Recent studies have shown that a nutritional supplement of CoQ10 can reduce the frequency of migraine attacks by improving the energy production of cells as with riboflavin.  Doses of 150 mg twice a day have been shown to be effective.   Melatonin: Increasing evidence shows correlation between melatonin secretion and headache conditions.  Melatonin supplementation has decreased headache intensity and duration.  It is widely used as a sleep aid.  Sleep is natures way of dealing with migraine.  A dose of 3 mg is recommended to start for headaches including cluster headache. Higher doses up to 15 mg has been reviewed for use in Cluster headache and have been used. The rationale behind using melatonin for cluster is that many theories regarding the cause of Cluster headache center around the disruption of the normal circadian rhythm in the brain.  This helps restore the normal circadian rhythm.   HEADACHE DIET: Foods and beverages which may trigger migraine Note that only 20% of headache patients are food sensitive. You will know if you are food sensitive if you get a headache consistently 20 minutes to 2 hours after eating a certain food. Only cut out a food if it causes headaches, otherwise you might remove foods you enjoy! What matters most for diet is to eat a well balanced healthy diet full of vegetables and low fat protein, and to not miss meals.   Chocolate, other sweets ALL cheeses except cottage and cream cheese Dairy products, yogurt, sour cream, ice cream Liver Meat extracts (Bovril, Marmite, meat tenderizers) Meats or fish which have undergone aging, fermenting, pickling or smoking. These include: Hotdogs,salami,Lox,sausage, mortadellas,smoked salmon, pepperoni, Pickled herring Pods of broad bean (English beans, Chinese pea pods, [place] (fava) beans, lima and navy beans Ripe avocado, ripe banana Yeast extracts or active yeast preparations such as Brewer's or Fleishman's (commercial  bakes goods are permitted) Tomato based foods, pizza (lasagna, etc.)   MSG (monosodium glutamate) is disguised as many things; look for these common aliases: Monopotassium glutamate Autolysed yeast Hydrolysed protein Sodium caseinate "flavorings" "all natural preservatives Nutrasweet   Avoid all other foods that convincingly provoke headaches.   Resources: The Dizzy Bluford Aid Your Headache Diet, migrainestrong.com  https://zamora-andrews.com/   Caffeine  and Migraine For patients that have migraine, caffeine  intake more than 3 days per week can lead to dependency and increased migraine frequency. I would recommend cutting back on your caffeine  intake as best you can. The recommended amount of caffeine  is 200-300 mg daily, although migraine patients may experience dependency at even lower doses. While you may notice an increase in headache temporarily, cutting back will be helpful for headaches in the long run. For more information on caffeine  and migraine, visit: https://americanmigrainefoundation.org/resource-library/caffeine -and-migraine/   Headache Prevention Strategies:   1. Maintain a headache diary; learn to identify and avoid triggers.  - This can be a simple note where you log when you had a headache, associated symptoms, and medications used - There are several smartphone apps developed to help track migraines: Migraine Buddy, Migraine Monitor, Curelator N1-Headache App   Common triggers include: Emotional triggers: Emotional/Upset family or friends Emotional/Upset  occupation Business reversal/success Anticipation anxiety Crisis-serious Post-crisis periodNew job/position   Physical triggers: Vacation Day Weekend Strenuous Exercise High Altitude Location New Move Menstrual Day Physical Illness Oversleep/Not enough sleep Weather changes Light: Photophobia or light sesnitivity treatment involves a balance between  desensitization and reduction in overly strong input. Use dark polarized glasses outside, but not inside. Avoid bright or fluorescent light, but do not dim environment to the point that going into a normally lit room hurts. Consider FL-41 tint lenses, which reduce the most irritating wavelengths without blocking too much light.  These can be obtained at axonoptics.com or theraspecs.com Foods: see list above.   2. Limit use of acute treatments (over-the-counter medications, triptans, etc.) to no more than 2 days per week or 10 days per month to prevent medication overuse headache (rebound headache).     3. Follow a regular schedule (including weekends and holidays): Don't skip meals. Eat a balanced diet. 8 hours of sleep nightly. Minimize stress. Exercise 30 minutes per day. Being overweight is associated with a 5 times increased risk of chronic migraine. Keep well hydrated and drink 6-8 glasses of water per day.   4. Initiate non-pharmacologic measures at the earliest onset of your headache. Rest and quiet environment. Relax and reduce stress. Breathe2Relax is a free app that can instruct you on    some simple relaxtion and breathing techniques. Http://Dawnbuse.com is a    free website that provides teaching videos on relaxation.  Also, there are  many apps that   can be downloaded for "mindful" relaxation.  An app called YOGA NIDRA will help walk you through mindfulness. Another app called Calm can be downloaded to give you a structured mindfulness guide with daily reminders and skill development. Headspace for guided meditation Mindfulness Based Stress Reduction Online Course: www.palousemindfulness.com Cold compresses.   5. Don't wait!! Take the maximum allowable dosage of prescribed medication at the first sign of migraine.   6. Compliance:  Take prescribed medication regularly as directed and at the first sign of a migraine.   7. Communicate:  Call your physician when problems arise,  especially if your headaches change, increase in frequency/severity, or become associated with neurological symptoms (weakness, numbness, slurred speech, etc.). Proceed to emergency room if you experience new or worsening symptoms or symptoms do not resolve, if you have new neurologic symptoms or if headache is severe, or for any concerning symptom.   8. Headache/pain management therapies: Consider various complementary methods, including medication, behavioral therapy, psychological counselling, biofeedback, massage therapy, acupuncture, dry needling, and other modalities.  Such measures may reduce the need for medications. Counseling for pain management, where patients learn to function and ignore/minimize their pain, seems to work very well.   9. Recommend changing family's attention and focus away from patient's headaches. Instead, emphasize daily activities. If first question of day is 'How are your headaches/Do you have a headache today?', then patient will constantly think about headaches, thus making them worse. Goal is to re-direct attention away from headaches, toward daily activities and other distractions.   10. Helpful Websites: www.AmericanHeadacheSociety.org PatentHood.ch www.headaches.org TightMarket.nl www.achenet.org

## 2023-10-10 ENCOUNTER — Encounter: Payer: Self-pay | Admitting: Family Medicine

## 2023-10-17 ENCOUNTER — Ambulatory Visit

## 2023-10-17 DIAGNOSIS — G43E09 Chronic migraine with aura, not intractable, without status migrainosus: Secondary | ICD-10-CM | POA: Diagnosis not present

## 2023-10-17 DIAGNOSIS — R200 Anesthesia of skin: Secondary | ICD-10-CM

## 2023-10-17 DIAGNOSIS — R202 Paresthesia of skin: Secondary | ICD-10-CM

## 2023-10-17 DIAGNOSIS — R29898 Other symptoms and signs involving the musculoskeletal system: Secondary | ICD-10-CM

## 2023-10-17 MED ORDER — GADOBENATE DIMEGLUMINE 529 MG/ML IV SOLN
10.0000 mL | Freq: Once | INTRAVENOUS | Status: AC | PRN
Start: 2023-10-17 — End: 2023-10-17
  Administered 2023-10-17: 10 mL via INTRAVENOUS

## 2023-10-18 ENCOUNTER — Ambulatory Visit: Payer: Self-pay | Admitting: Family Medicine

## 2023-10-23 DIAGNOSIS — B9689 Other specified bacterial agents as the cause of diseases classified elsewhere: Secondary | ICD-10-CM | POA: Diagnosis not present

## 2023-10-23 DIAGNOSIS — Z113 Encounter for screening for infections with a predominantly sexual mode of transmission: Secondary | ICD-10-CM | POA: Diagnosis not present

## 2023-10-23 DIAGNOSIS — N9489 Other specified conditions associated with female genital organs and menstrual cycle: Secondary | ICD-10-CM | POA: Diagnosis not present

## 2023-10-23 DIAGNOSIS — R300 Dysuria: Secondary | ICD-10-CM | POA: Diagnosis not present

## 2023-10-23 DIAGNOSIS — N760 Acute vaginitis: Secondary | ICD-10-CM | POA: Diagnosis not present

## 2023-10-24 DIAGNOSIS — R300 Dysuria: Secondary | ICD-10-CM | POA: Diagnosis not present

## 2023-11-20 DIAGNOSIS — G43829 Menstrual migraine, not intractable, without status migrainosus: Secondary | ICD-10-CM | POA: Diagnosis not present

## 2023-11-20 DIAGNOSIS — Z6821 Body mass index (BMI) 21.0-21.9, adult: Secondary | ICD-10-CM | POA: Diagnosis not present

## 2023-11-20 DIAGNOSIS — R399 Unspecified symptoms and signs involving the genitourinary system: Secondary | ICD-10-CM | POA: Diagnosis not present

## 2023-12-03 ENCOUNTER — Telehealth: Payer: Self-pay | Admitting: Pharmacist

## 2023-12-03 DIAGNOSIS — Z01419 Encounter for gynecological examination (general) (routine) without abnormal findings: Secondary | ICD-10-CM | POA: Diagnosis not present

## 2023-12-03 DIAGNOSIS — Z6820 Body mass index (BMI) 20.0-20.9, adult: Secondary | ICD-10-CM | POA: Diagnosis not present

## 2023-12-03 DIAGNOSIS — Z124 Encounter for screening for malignant neoplasm of cervix: Secondary | ICD-10-CM | POA: Diagnosis not present

## 2023-12-03 DIAGNOSIS — Z1151 Encounter for screening for human papillomavirus (HPV): Secondary | ICD-10-CM | POA: Diagnosis not present

## 2023-12-03 NOTE — Telephone Encounter (Signed)
 Pharmacy Patient Advocate Encounter  Received notification from [facility] that Prior Authorization for UBRELVY  100 MG PO TABS has been APPROVED from 12/03/2023 to 12/02/2024   PA #/Case ID/Reference #: [id number]

## 2023-12-03 NOTE — Telephone Encounter (Signed)
 Pharmacy Patient Advocate Encounter   Received notification from CoverMyMeds that prior authorization for Ubrelvy  100MG  tablets is required/requested.   Insurance verification completed.   The patient is insured through [facility].   Per test claim: PA required; PA submitted to above mentioned insurance via Latent Key/confirmation #/EOC [facility] Status is pending

## 2023-12-25 DIAGNOSIS — R102 Pelvic and perineal pain unspecified side: Secondary | ICD-10-CM | POA: Diagnosis not present

## 2023-12-25 DIAGNOSIS — N760 Acute vaginitis: Secondary | ICD-10-CM | POA: Diagnosis not present
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
## 2024-01-10 NOTE — Patient Instructions (Signed)
 Below is our plan:  We will continue Ubrelvy and ondansetron as needed.   Please make sure you are staying well hydrated. I recommend 50-60 ounces daily. Well balanced diet and regular exercise encouraged. Consistent sleep schedule with 6-8 hours recommended.   Please continue follow up with care team as directed.   Follow up with me in 1 year   You may receive a survey regarding today's visit. I encourage you to leave honest feed back as I do use this information to improve patient care. Thank you for seeing me today!   'GENERAL HEADACHE INFORMATION:   Natural supplements: Magnesium Oxide or Magnesium Glycinate 500 mg at bed (up to 800 mg daily) Coenzyme Q10 300 mg in AM Vitamin B2- 200 mg twice a day   Add 1 supplement at a time since even natural supplements can have undesirable side effects. You can sometimes buy supplements cheaper (especially Coenzyme Q10) at www.WebmailGuide.co.za or at [facility].  Migraine with aura: There is increased risk for stroke in women with migraine with aura and a contraindication for the combined contraceptive pill for use by women who have migraine with aura. The risk for women with migraine without aura is lower. However other risk factors like smoking are far more likely to increase stroke risk than migraine. There is a recommendation for no smoking and for the use of OCPs without estrogen such as progestogen only pills particularly for women with migraine with aura.Marland Kitchen People who have migraine headaches with auras may be 3 times more likely to have a stroke caused by a blood clot, compared to migraine patients who don't see auras. Women who take hormone-replacement therapy may be 30 percent more likely to suffer a clot-based stroke than women not taking medication containing estrogen. Other risk factors like smoking and high blood pressure may be  much more important.    Vitamins and herbs that show potential:   Magnesium: Magnesium (250 mg twice a day or 500 mg at  bed) has a relaxant effect on smooth muscles such as blood vessels. Individuals suffering from frequent or daily headache usually have low magnesium levels which can be increase with daily supplementation of 400-750 mg. Three trials found 40-90% average headache reduction  when used as a preventative. Magnesium may help with headaches are aura, the best evidence for magnesium is for migraine with aura is its thought to stop the cortical spreading depression we believe is the pathophysiology of migraine aura.Magnesium also demonstrated the benefit in menstrually related migraine.  Magnesium is part of the messenger system in the serotonin cascade and it is a good muscle relaxant.  It is also useful for constipation which can be a side effect of other medications used to treat migraine. Good sources include nuts, whole grains, and tomatoes. Side Effects: loose stool/diarrhea  Riboflavin (vitamin B 2) 200 mg twice a day. This vitamin assists nerve cells in the production of ATP a principal energy storing molecule.  It is necessary for many chemical reactions in the body.  There have been at least 3 clinical trials of riboflavin using 400 mg per day all of which suggested that migraine frequency can be decreased.  All 3 trials showed significant improvement in over half of migraine sufferers.  The supplement is found in bread, cereal, milk, meat, and poultry.  Most Americans get more riboflavin than the recommended daily allowance, however riboflavin deficiency is not necessary for the supplements to help prevent headache. Side effects: energizing, green urine   Coenzyme Q10:  This is present in almost all cells in the body and is critical component for the conversion of energy.  Recent studies have shown that a nutritional supplement of CoQ10 can reduce the frequency of migraine attacks by improving the energy production of cells as with riboflavin.  Doses of 150 mg twice a day have been shown to be effective.    Melatonin: Increasing evidence shows correlation between melatonin secretion and headache conditions.  Melatonin supplementation has decreased headache intensity and duration.  It is widely used as a sleep aid.  Sleep is natures way of dealing with migraine.  A dose of 3 mg is recommended to start for headaches including cluster headache. Higher doses up to 15 mg has been reviewed for use in Cluster headache and have been used. The rationale behind using melatonin for cluster is that many theories regarding the cause of Cluster headache center around the disruption of the normal circadian rhythm in the brain.  This helps restore the normal circadian rhythm.   HEADACHE DIET: Foods and beverages which may trigger migraine Note that only 20% of headache patients are food sensitive. You will know if you are food sensitive if you get a headache consistently 20 minutes to 2 hours after eating a certain food. Only cut out a food if it causes headaches, otherwise you might remove foods you enjoy! What matters most for diet is to eat a well balanced healthy diet full of vegetables and low fat protein, and to not miss meals.   Chocolate, other sweets ALL cheeses except cottage and cream cheese Dairy products, yogurt, sour cream, ice cream Liver Meat extracts (Bovril, Marmite, meat tenderizers) Meats or fish which have undergone aging, fermenting, pickling or smoking. These include: Hotdogs,salami,Lox,sausage, mortadellas,smoked salmon, pepperoni, Pickled herring Pods of broad bean (English beans, Chinese pea pods, [place] (fava) beans, lima and navy beans Ripe avocado, ripe banana Yeast extracts or active yeast preparations such as Brewer's or Fleishman's (commercial bakes goods are permitted) Tomato based foods, pizza (lasagna, etc.)   MSG (monosodium glutamate) is disguised as many things; look for these common aliases: Monopotassium glutamate Autolysed yeast Hydrolysed protein Sodium  caseinate "flavorings" "all natural preservatives" Nutrasweet   Avoid all other foods that convincingly provoke headaches.   Resources: The Dizzy [place] Laundry Your Headache Diet, migrainestrong.com  https://zamora-andrews.com/   Caffeine and Migraine For patients that have migraine, caffeine intake more than 3 days per week can lead to dependency and increased migraine frequency. I would recommend cutting back on your caffeine intake as best you can. The recommended amount of caffeine is 200-300 mg daily, although migraine patients may experience dependency at even lower doses. While you may notice an increase in headache temporarily, cutting back will be helpful for headaches in the long run. For more information on caffeine and migraine, visit: https://americanmigrainefoundation.org/resource-library/caffeine-and-migraine/   Headache Prevention Strategies:   1. Maintain a headache diary; learn to identify and avoid triggers.  - This can be a simple note where you log when you had a headache, associated symptoms, and medications used - There are several smartphone apps developed to help track migraines: Migraine Buddy, Migraine Monitor, Curelator N1-Headache App   Common triggers include: Emotional triggers: Emotional/Upset family or friends Emotional/Upset occupation Business reversal/success Anticipation anxiety Crisis-serious Post-crisis periodNew job/position   Physical triggers: Vacation Day Weekend Strenuous Exercise High Altitude Location New Move Menstrual Day Physical Illness Oversleep/Not enough sleep Weather changes Light: Photophobia or light sesnitivity treatment involves a balance between desensitization and reduction in overly  strong input. Use dark polarized glasses outside, but not inside. Avoid bright or fluorescent light, but do not dim environment to the point that going into a normally lit room hurts. Consider  FL-41 tint lenses, which reduce the most irritating wavelengths without blocking too much light.  These can be obtained at axonoptics.com or theraspecs.com Foods: see list above.   2. Limit use of acute treatments (over-the-counter medications, triptans, etc.) to no more than 2 days per week or 10 days per month to prevent medication overuse headache (rebound headache).     3. Follow a regular schedule (including weekends and holidays): Don't skip meals. Eat a balanced diet. 8 hours of sleep nightly. Minimize stress. Exercise 30 minutes per day. Being overweight is associated with a 5 times increased risk of chronic migraine. Keep well hydrated and drink 6-8 glasses of water per day.   4. Initiate non-pharmacologic measures at the earliest onset of your headache. Rest and quiet environment. Relax and reduce stress. Breathe2Relax is a free app that can instruct you on    some simple relaxtion and breathing techniques. Http://Dawnbuse.com is a    free website that provides teaching videos on relaxation.  Also, there are  many apps that   can be downloaded for "mindful" relaxation.  An app called YOGA NIDRA will help walk you through mindfulness. Another app called Calm can be downloaded to give you a structured mindfulness guide with daily reminders and skill development. Headspace for guided meditation Mindfulness Based Stress Reduction Online Course: www.palousemindfulness.com Cold compresses.   5. Don't wait!! Take the maximum allowable dosage of prescribed medication at the first sign of migraine.   6. Compliance:  Take prescribed medication regularly as directed and at the first sign of a migraine.   7. Communicate:  Call your physician when problems arise, especially if your headaches change, increase in frequency/severity, or become associated with neurological symptoms (weakness, numbness, slurred speech, etc.). Proceed to emergency room if you experience new or worsening symptoms or  symptoms do not resolve, if you have new neurologic symptoms or if headache is severe, or for any concerning symptom.   8. Headache/pain management therapies: Consider various complementary methods, including medication, behavioral therapy, psychological counselling, biofeedback, massage therapy, acupuncture, dry needling, and other modalities.  Such measures may reduce the need for medications. Counseling for pain management, where patients learn to function and ignore/minimize their pain, seems to work very well.   9. Recommend changing family's attention and focus away from patient's headaches. Instead, emphasize daily activities. If first question of day is 'How are your headaches/Do you have a headache today?', then patient will constantly think about headaches, thus making them worse. Goal is to re-direct attention away from headaches, toward daily activities and other distractions.   10. Helpful Websites: www.AmericanHeadacheSociety.org PatentHood.ch www.headaches.org TightMarket.nl www.achenet.org

## 2024-01-14 ENCOUNTER — Telehealth: Payer: BC Managed Care – PPO | Admitting: Family Medicine

## 2024-01-14 ENCOUNTER — Encounter: Payer: Self-pay | Admitting: Family Medicine

## 2024-01-14 DIAGNOSIS — G43E09 Chronic migraine with aura, not intractable, without status migrainosus: Secondary | ICD-10-CM | POA: Diagnosis not present

## 2024-01-14 DIAGNOSIS — G43009 Migraine without aura, not intractable, without status migrainosus: Secondary | ICD-10-CM

## 2024-01-14 MED ORDER — ONDANSETRON HCL 4 MG PO TABS: 4.0000 mg | ORAL_TABLET | Freq: Three times a day (TID) | ORAL | 0 refills | Status: AC | PRN

## 2024-01-14 MED ORDER — UBRELVY 100 MG PO TABS: ORAL_TABLET | ORAL | 5 refills | Status: AC

## 2024-09-16 ENCOUNTER — Telehealth: Admitting: Family Medicine
